# Patient Record
Sex: Female | Born: 2000 | Race: Black or African American | Hispanic: Yes | Marital: Single | Smoking: Never smoker
Health system: Southern US, Community
[De-identification: ages and names within clinical notes are randomized; demographics above are authoritative.]

## PROBLEM LIST (undated history)

## (undated) DIAGNOSIS — F411 Generalized anxiety disorder: Secondary | ICD-10-CM

## (undated) DIAGNOSIS — F320 Major depressive disorder, single episode, mild: Secondary | ICD-10-CM

## (undated) HISTORY — PX: TONSILLECTOMY AND ADENOIDECTOMY: SUR1326

---
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
## 2021-09-17 DIAGNOSIS — F338 Other recurrent depressive disorders: Secondary | ICD-10-CM | POA: Diagnosis not present

## 2021-09-17 DIAGNOSIS — R41840 Attention and concentration deficit: Secondary | ICD-10-CM | POA: Diagnosis not present

## 2021-09-17 DIAGNOSIS — F419 Anxiety disorder, unspecified: Secondary | ICD-10-CM | POA: Diagnosis not present

## 2021-11-20 ENCOUNTER — Other Ambulatory Visit: Payer: Self-pay

## 2021-11-20 ENCOUNTER — Encounter: Payer: Self-pay | Admitting: Family Medicine

## 2021-11-20 ENCOUNTER — Ambulatory Visit (INDEPENDENT_AMBULATORY_CARE_PROVIDER_SITE_OTHER): Payer: BC Managed Care – PPO | Admitting: Family Medicine

## 2021-11-20 VITALS — BP 122/68 | HR 76 | Temp 98.4°F | Ht 63.11 in | Wt 178.0 lb

## 2021-11-20 DIAGNOSIS — F909 Attention-deficit hyperactivity disorder, unspecified type: Secondary | ICD-10-CM

## 2021-11-20 DIAGNOSIS — F320 Major depressive disorder, single episode, mild: Secondary | ICD-10-CM | POA: Diagnosis not present

## 2021-11-20 DIAGNOSIS — Z0000 Encounter for general adult medical examination without abnormal findings: Secondary | ICD-10-CM | POA: Diagnosis not present

## 2021-11-20 DIAGNOSIS — F411 Generalized anxiety disorder: Secondary | ICD-10-CM

## 2021-11-20 DIAGNOSIS — Z23 Encounter for immunization: Secondary | ICD-10-CM | POA: Diagnosis not present

## 2021-11-20 NOTE — Progress Notes (Signed)
Assessment & Plan:  1. Well adult exam Preventive health education provided. - CBC with Differential/Platelet - CMP14+EGFR - Lipid panel  2. Depression, major, single episode, mild (HCC) Uncontrolled. Gene Sight testing completed today.  - Ambulatory referral to Psychiatry  3. GAD (generalized anxiety disorder) Uncontrolled. Gene Sight testing completed today.  - Ambulatory referral to Psychiatry  4. Attention deficit hyperactivity disorder (ADHD), unspecified ADHD type Uncontrolled. Gene Sight testing completed today.  - Ambulatory referral to Psychiatry  5. Need for immunization against influenza - Flu Vaccine QUAD 77moIM (Fluarix, Fluzone & Alfiuria Quad PF)   Follow-up: Return in about 2 months (around 01/20/2022) for depression/anxiety.   BHendricks Limes MSN, APRN, FNP-C [facility]  Subjective:  Patient ID: Susan Perez female    DOB: 604-27-2002 Age: 20y.o. MRN: [id number] Patient Care Team: JLoman Brooklyn FNP as PCP - General (Family Medicine)   CC:  Chief Complaint  Patient presents with   Annual Exam    HPI Susan Tischerpresents for her annual physical.   Occupation: sDentist Marital status: single, Substance use: none Diet: regular, Exercise: none Last eye exam: today Last dental exam: unknown Hepatitis C Screening: declined Immunizations: Flu Vaccine:  agreeable to get today Tdap Vaccine: up to date  COVID-19 Vaccine:  patient reports having had previously   Anxiety/Depression: patient is not currently taking medication to treat. Previously failed multiple medications, but does not remember all of them. Upon chart review, I can find Cymbalta, Zoloft, Lexapro, and Prozac. She was previously seeing a psychiatrist in college and needs to get established with a new one. She is still doing therapy. She also reports she [facility], but is unable to afford the  follow-up appointments ($300). They did give her a 30 day supply of Adderal when she was diagnosed which did help her focus.  [facility] 11/20/2021 10/18/2019 02/08/2019  Decreased Interest 1 2 2   Down, Depressed, Hopeless 1 1 1   PHQ - 2 Score 2 3 3   Altered sleeping 3 3 3   Tired, decreased energy 3 3 2   Change in appetite 0 3 2  Feeling bad or failure about yourself  1 1 1   Trouble concentrating 3 3 3   Moving slowly or fidgety/restless 0 0 1  Suicidal thoughts 0 0 0  PHQ-9 Score 12 16 15   Difficult doing work/chores Very difficult Not difficult at all -  Some recent data might be hidden   GAD 7 : Generalized Anxiety Score 11/20/2021 10/18/2019  Nervous, Anxious, on Edge 3 3  Control/stop worrying 3 3  Worry too much - different things 3 3  Trouble relaxing 1 3  Restless 1 2  Easily annoyed or irritable 1 1  Afraid - awful might happen 0 0  Total GAD 7 Score 12 15  Anxiety Difficulty Very difficult Not difficult at all    Review of Systems  Constitutional:  Negative for chills, fever, malaise/fatigue and weight loss.  HENT:  Negative for congestion, ear discharge, ear pain, nosebleeds, sinus pain, sore throat and tinnitus.   Eyes:  Negative for blurred vision, double vision, pain, discharge and redness.  Respiratory:  Negative for cough, shortness of breath and wheezing.   Cardiovascular:  Negative for chest pain, palpitations and leg swelling.  Gastrointestinal:  Negative for abdominal pain, constipation, diarrhea, heartburn, nausea and vomiting.  Genitourinary:  Negative for dysuria, frequency and urgency.  Musculoskeletal:  Negative for  myalgias.  Skin:  Negative for rash.  Neurological:  Negative for dizziness, seizures, weakness and headaches.  Psychiatric/Behavioral:  Positive for depression. Negative for substance abuse and suicidal ideas. The patient is nervous/anxious.     Current Outpatient Medications:    DULoxetine (CYMBALTA) 30 MG capsule, Take 1  capsule (30 mg total) by mouth daily. (Patient not taking: Reported on 11/20/2021), Disp: 30 capsule, Rfl: 2  Allergies  Allergen Reactions   Delsym [Dextromethorphan]     Past Medical History:  Diagnosis Date   Depression, major, single episode, mild ([place]) 02/08/2019   GAD (generalized anxiety disorder) 02/08/2019    Past Surgical History:  Procedure Laterality Date   TONSILLECTOMY AND ADENOIDECTOMY      Family History  Problem Relation Age of Onset   Asthma Father     Social History   Socioeconomic History   Marital status: Single    Spouse name: Not on file   Number of children: Not on file   Years of education: Not on file   Highest education level: Not on file  Occupational History   Not on file  Tobacco Use   Smoking status: Never   Smokeless tobacco: Never  Vaping Use   Vaping Use: Never used  Substance and Sexual Activity   Alcohol use: Never   Drug use: Never   Sexual activity: Never  Other Topics Concern   Not on file  Social History Narrative   Not on file   Social Determinants of Health   Financial Resource Strain: Not on file  Food Insecurity: Not on file  Transportation Needs: Not on file  Physical Activity: Not on file  Stress: Not on file  Social Connections: Not on file  Intimate Partner Violence: Not on file      Objective:    BP 122/68   Pulse 76   Temp 98.4 F (36.9 C) (Temporal)   Ht 5' 3.11" (1.603 m)   Wt 178 lb (80.7 kg)   SpO2 95%   BMI 31.42 kg/m   Wt Readings from Last 3 Encounters:  11/20/21 178 lb (80.7 kg) (94 %, Z= 1.57)*  10/18/19 180 lb (81.6 kg) (96 %, Z= 1.71)*  02/08/19 172 lb (78 kg) (95 %, Z= 1.60)*   * Growth percentiles are based on CDC (Girls, 2-20 Years) data.    Physical Exam Vitals reviewed. Exam conducted with a chaperone present.  Constitutional:      General: She is not in acute distress.    Appearance: Normal appearance. She is not ill-appearing, toxic-appearing or diaphoretic.  HENT:      Head: Normocephalic and atraumatic.     Right Ear: Tympanic membrane, ear canal and external ear normal. There is no impacted cerumen.     Left Ear: Tympanic membrane, ear canal and external ear normal. There is no impacted cerumen.     Nose: Nose normal. No congestion or rhinorrhea.     Mouth/Throat:     Mouth: Mucous membranes are moist.     Pharynx: Oropharynx is clear. No oropharyngeal exudate or posterior oropharyngeal erythema.  Eyes:     General: No scleral icterus.       Right eye: No discharge.        Left eye: No discharge.     Conjunctiva/sclera: Conjunctivae normal.     Pupils: Pupils are equal, round, and reactive to light.  Cardiovascular:     Rate and Rhythm: Normal rate and regular rhythm.     Heart sounds: Normal  heart sounds. No murmur heard.   No friction rub. No gallop.  Pulmonary:     Effort: Pulmonary effort is normal. No respiratory distress.     Breath sounds: Normal breath sounds. No stridor. No wheezing, rhonchi or rales.  Chest:  Breasts:    Breasts are symmetrical.     Right: Normal.     Left: Normal.  Abdominal:     General: Abdomen is flat. Bowel sounds are normal. There is no distension.     Palpations: Abdomen is soft. There is no hepatomegaly, splenomegaly or mass.     Tenderness: There is no abdominal tenderness. There is no guarding or rebound.     Hernia: No hernia is present.  Musculoskeletal:        General: Normal range of motion.     Cervical back: Normal range of motion and neck supple. No rigidity. No muscular tenderness.  Lymphadenopathy:     Cervical: No cervical adenopathy.     Upper Body:     Right upper body: No supraclavicular, axillary or pectoral adenopathy.     Left upper body: No supraclavicular, axillary or pectoral adenopathy.  Skin:    General: Skin is warm and dry.     Capillary Refill: Capillary refill takes less than 2 seconds.  Neurological:     General: No focal deficit present.     Mental Status: She is alert and  oriented to person, place, and time. Mental status is at baseline.  Psychiatric:        Mood and Affect: Mood normal.        Behavior: Behavior normal.        Thought Content: Thought content normal.        Judgment: Judgment normal.    Lab Results  Component Value Date   TSH 3.150 09/20/2018   Lab Results  Component Value Date   WBC 9.8 09/20/2018   HGB 13.6 09/20/2018   HCT 39.9 09/20/2018   MCV 86 09/20/2018   PLT 361 09/20/2018   Lab Results  Component Value Date   NA 140 09/20/2018   K 4.1 09/20/2018   CO2 25 09/20/2018   GLUCOSE 78 09/20/2018   BUN 15 09/20/2018   CREATININE 0.66 09/20/2018   CALCIUM 10.2 09/20/2018   Lab Results  Component Value Date   CHOL 199 (H) 09/20/2018   Lab Results  Component Value Date   HDL 49 09/20/2018   Lab Results  Component Value Date   LDLCALC 136 (H) 09/20/2018   Lab Results  Component Value Date   TRIG 69 09/20/2018   Lab Results  Component Value Date   CHOLHDL 4.1 09/20/2018   No results found for: HGBA1C

## 2021-11-21 LAB — LIPID PANEL
Chol/HDL Ratio: 4.3 ratio (ref 0.0–4.4)
Cholesterol, Total: 220 mg/dL — ABNORMAL HIGH (ref 100–169)
HDL: 51 mg/dL (ref >39)
LDL Chol Calc (NIH): 156 mg/dL — ABNORMAL HIGH (ref 0–109)
Triglycerides: 73 mg/dL (ref 0–89)
VLDL Cholesterol Cal: 13 mg/dL (ref 5–40)

## 2021-11-21 LAB — CBC WITH DIFFERENTIAL/PLATELET
Basophils Absolute: 0.1 10*3/uL (ref 0.0–0.2)
Basos: 1 %
EOS (ABSOLUTE): 0.1 10*3/uL (ref 0.0–0.4)
Eos: 1 %
Hematocrit: 39.4 % (ref 34.0–46.6)
Hemoglobin: 13.3 g/dL (ref 11.1–15.9)
Immature Grans (Abs): 0 10*3/uL (ref 0.0–0.1)
Immature Granulocytes: 0 %
Lymphocytes Absolute: 2.6 10*3/uL (ref 0.7–3.1)
Lymphs: 32 %
MCH: 29.1 pg (ref 26.6–33.0)
MCHC: 33.8 g/dL (ref 31.5–35.7)
MCV: 86 fL (ref 79–97)
Monocytes Absolute: 0.6 10*3/uL (ref 0.1–0.9)
Monocytes: 8 %
Neutrophils Absolute: 4.9 10*3/uL (ref 1.4–7.0)
Neutrophils: 58 %
Platelets: 275 10*3/uL (ref 150–450)
RBC: 4.57 x10E6/uL (ref 3.77–5.28)
RDW: 12.9 % (ref 11.7–15.4)
WBC: 8.3 10*3/uL (ref 3.4–10.8)

## 2021-11-21 LAB — CMP14+EGFR
ALT: 9 IU/L (ref 0–32)
AST: 13 IU/L (ref 0–40)
Albumin/Globulin Ratio: 1.9 (ref 1.2–2.2)
Albumin: 4.4 g/dL (ref 3.9–5.0)
Alkaline Phosphatase: 88 IU/L (ref 42–106)
BUN/Creatinine Ratio: 24 — ABNORMAL HIGH (ref 9–23)
BUN: 15 mg/dL (ref 6–20)
Bilirubin Total: 0.2 mg/dL (ref 0.0–1.2)
CO2: 24 mmol/L (ref 20–29)
Calcium: 9.5 mg/dL (ref 8.7–10.2)
Chloride: 103 mmol/L (ref 96–106)
Creatinine, Ser: 0.62 mg/dL (ref 0.57–1.00)
Globulin, Total: 2.3 g/dL (ref 1.5–4.5)
Glucose: 82 mg/dL (ref 70–99)
Potassium: 4.2 mmol/L (ref 3.5–5.2)
Sodium: 139 mmol/L (ref 134–144)
Total Protein: 6.7 g/dL (ref 6.0–8.5)
eGFR: 131 mL/min/{1.73_m2} (ref >59)

## 2021-11-25 ENCOUNTER — Encounter: Payer: Self-pay | Admitting: Family Medicine

## 2021-11-25 DIAGNOSIS — E785 Hyperlipidemia, unspecified: Secondary | ICD-10-CM

## 2021-11-25 DIAGNOSIS — F909 Attention-deficit hyperactivity disorder, unspecified type: Secondary | ICD-10-CM | POA: Insufficient documentation

## 2021-11-25 HISTORY — DX: Hyperlipidemia, unspecified: E78.5

## 2021-11-26 NOTE — Progress Notes (Signed)
Chart updated

## 2021-12-11 ENCOUNTER — Telehealth: Payer: Self-pay | Admitting: Family Medicine

## 2021-12-11 DIAGNOSIS — F411 Generalized anxiety disorder: Secondary | ICD-10-CM

## 2021-12-11 DIAGNOSIS — F320 Major depressive disorder, single episode, mild: Secondary | ICD-10-CM

## 2021-12-11 MED ORDER — VILAZODONE HCL 20 MG PO TABS: ORAL_TABLET | ORAL | 2 refills | Status: DC

## 2021-12-11 NOTE — Telephone Encounter (Signed)
Pt said that she will try the Viibryd.

## 2021-12-11 NOTE — Telephone Encounter (Signed)
Lmtcb.

## 2021-12-11 NOTE — Telephone Encounter (Signed)
Please let patient know I received her GeneSight test results.  I recommend trying Viibryd if she is agreeable.  Please let me know and I will send this in.

## 2021-12-11 NOTE — Telephone Encounter (Signed)
Viibryd rx'd.

## 2022-01-16 ENCOUNTER — Ambulatory Visit (INDEPENDENT_AMBULATORY_CARE_PROVIDER_SITE_OTHER): Payer: BC Managed Care – PPO | Admitting: Family Medicine

## 2022-01-16 ENCOUNTER — Encounter: Payer: Self-pay | Admitting: Family Medicine

## 2022-01-16 VITALS — BP 116/72 | HR 62 | Temp 98.4°F | Ht 63.11 in | Wt 179.0 lb

## 2022-01-16 DIAGNOSIS — F320 Major depressive disorder, single episode, mild: Secondary | ICD-10-CM | POA: Diagnosis not present

## 2022-01-16 DIAGNOSIS — F411 Generalized anxiety disorder: Secondary | ICD-10-CM

## 2022-01-16 MED ORDER — VILAZODONE HCL 40 MG PO TABS: 40.0000 mg | ORAL_TABLET | Freq: Every day | ORAL | 2 refills | Status: DC

## 2022-01-16 NOTE — Progress Notes (Signed)
Assessment & Plan:  1-2. Depression, major, single episode, mild (HCC)/GAD (generalized anxiety disorder) - improving, increase Viibryd from 20 mg to 40 mg daily - printed information about mindfulness provided   Return in about 6 weeks (around 02/27/2022) for Depression/Anxiety.  Susan Crater, NP Student  I personally was present during the history, physical exam, and medical decision-making activities of this service and have verified that the service and findings are accurately documented in the nurse practitioner student's note.  Susan Limes, MSN, APRN, FNP-C [facility]  Subjective:    Patient ID: Susan Perez, female    DOB: 2001/03/05, 21 y.o.   MRN: [id number]  Patient Care Team: Loman Brooklyn, FNP as PCP - General (Family Medicine)   Chief Complaint:  Chief Complaint  Patient presents with   Anxiety   Depression    2 month follow up- states it is better    HPI: Susan Perez is a 21 y.o. female presenting on 01/16/2022 for Anxiety and Depression (2 month follow up- states it is better)  She was started on Viibryd 20 mg after her Genesight test resulted. She states this is working well for her and she feels better but is not quite where she wants to be.   Depression screen [facility] 2/9 01/16/2022 11/20/2021 10/18/2019 02/08/2019 01/06/2019  Decreased Interest _0 Down, Depressed, Hopeless _1 PHQ - 2 Score _2 Altered sleeping _3 Tired, decreased energy _4 Change in appetite 0 0 _5 Feeling bad or failure about yourself  0 _6 Trouble concentrating _7 Moving slowly or fidgety/restless 0 0 0 1 3  Suicidal thoughts 0 0 0 0 0  PHQ-9 Score _8 Difficult doing work/chores Somewhat difficult Very difficult Not difficult at all - Somewhat difficult  Some recent data might be hidden   GAD 7 : Generalized Anxiety Score 01/16/2022 11/20/2021 10/18/2019  Nervous, Anxious, on Edge _9 Control/stop worrying _10 Worry too much - different things _11 Trouble relaxing _12 Restless _13 Easily annoyed or irritable _14 Afraid - awful might happen 1 0 0  Total GAD 7 Score _15 Anxiety Difficulty Somewhat difficult Very difficult Not difficult at all   New complaints: None  Social history:  Relevant past medical, surgical, family and social history reviewed and updated as indicated. Interim medical history since our last visit reviewed.  Allergies and medications reviewed and updated.  DATA REVIEWED: CHART IN EPIC  ROS: Negative unless specifically indicated above in HPI.    Current Outpatient Medications:    Vilazodone HCl (VIIBRYD) 20 MG TABS, Take 10 mg by mouth daily x7 days, then increase to 20 mg daily. (Patient taking differently: Take 20 mg by mouth daily at 2 PM.), Disp: 30 tablet, Rfl: 2   Allergies  Allergen Reactions   Delsym [Dextromethorphan]    Past Medical History:  Diagnosis Date   Depression, major, single episode, mild ([place]) 02/08/2019   GAD (generalized anxiety disorder) 02/08/2019   Hyperlipidemia 11/25/2021    Past Surgical History:  Procedure Laterality Date   TONSILLECTOMY AND ADENOIDECTOMY      Social History   Socioeconomic History   Marital status: Single  Spouse name: Not on file   Number of children: Not on file   Years of education: Not on file   Highest education level: Not on file  Occupational History   Occupation: Dentist    Employer: [employer]  Tobacco Use   Smoking status: Never   Smokeless tobacco: Never  Vaping Use   Vaping Use: Never used  Substance and Sexual Activity   Alcohol use: Never   Drug use: Never   Sexual activity: Never  Other Topics Concern   Not on file  Social History Narrative   Not on file   Social Determinants of Health   Financial Resource Strain: Not on file  Food Insecurity: Not on file  Transportation Needs: Not on file  Physical  Activity: Not on file  Stress: Not on file  Social Connections: Not on file  Intimate Partner Violence: Not on file        Objective:    BP 116/72    Pulse 62    Temp 98.4 F (36.9 C) (Temporal)    Ht 5' 3.11" (1.603 m)    Wt 81.2 kg    SpO2 100%    BMI 31.60 kg/m   Wt Readings from Last 3 Encounters:  01/16/22 179 lb (81.2 kg) (94 %, Z= 1.58)*  11/20/21 178 lb (80.7 kg) (94 %, Z= 1.57)*  10/18/19 180 lb (81.6 kg) (96 %, Z= 1.71)*   * Growth percentiles are based on CDC (Girls, 2-20 Years) data.    Physical Exam Vitals reviewed.  Constitutional:      General: She is not in acute distress.    Appearance: Normal appearance. She is not ill-appearing, toxic-appearing or diaphoretic.  HENT:     Head: Normocephalic and atraumatic.     Nose: Nose normal.     Mouth/Throat:     Mouth: Mucous membranes are moist.     Pharynx: Oropharynx is clear.  Eyes:     Extraocular Movements: Extraocular movements intact.     Conjunctiva/sclera: Conjunctivae normal.     Pupils: Pupils are equal, round, and reactive to light.  Pulmonary:     Effort: Pulmonary effort is normal. No respiratory distress.  Abdominal:     General: There is no distension.     Palpations: Abdomen is soft. There is no mass.  Musculoskeletal:        General: Normal range of motion.     Cervical back: Normal range of motion.  Skin:    General: Skin is warm and dry.  Neurological:     General: No focal deficit present.     Mental Status: She is alert and oriented to person, place, and time.     Motor: No weakness.     Gait: Gait normal.  Psychiatric:        Mood and Affect: Mood normal.        Behavior: Behavior normal.        Thought Content: Thought content normal.        Judgment: Judgment normal.    Lab Results  Component Value Date   TSH 3.150 09/20/2018   Lab Results  Component Value Date   WBC 8.3 11/20/2021   HGB 13.3 11/20/2021   HCT 39.4 11/20/2021   MCV 86 11/20/2021   PLT 275 11/20/2021    Lab Results  Component Value Date   NA 139 11/20/2021   K 4.2 11/20/2021   CO2 24 11/20/2021   GLUCOSE 82 11/20/2021   BUN 15  11/20/2021   CREATININE 0.62 11/20/2021   BILITOT 0.2 11/20/2021   ALKPHOS 88 11/20/2021   AST 13 11/20/2021   ALT 9 11/20/2021   PROT 6.7 11/20/2021   ALBUMIN 4.4 11/20/2021   CALCIUM 9.5 11/20/2021   EGFR 131 11/20/2021   Lab Results  Component Value Date   CHOL 220 (H) 11/20/2021   Lab Results  Component Value Date   HDL 51 11/20/2021   Lab Results  Component Value Date   LDLCALC 156 (H) 11/20/2021   Lab Results  Component Value Date   TRIG 73 11/20/2021   Lab Results  Component Value Date   CHOLHDL 4.3 11/20/2021   No results found for: HGBA1C

## 2022-01-17 ENCOUNTER — Encounter: Payer: Self-pay | Admitting: Family Medicine

## 2022-02-28 ENCOUNTER — Encounter: Payer: Self-pay | Admitting: Family Medicine

## 2022-02-28 ENCOUNTER — Ambulatory Visit (INDEPENDENT_AMBULATORY_CARE_PROVIDER_SITE_OTHER): Payer: BC Managed Care – PPO | Admitting: Family Medicine

## 2022-02-28 VITALS — BP 113/75 | HR 85 | Temp 98.2°F | Ht 63.11 in | Wt 177.8 lb

## 2022-02-28 DIAGNOSIS — F909 Attention-deficit hyperactivity disorder, unspecified type: Secondary | ICD-10-CM | POA: Diagnosis not present

## 2022-02-28 DIAGNOSIS — F411 Generalized anxiety disorder: Secondary | ICD-10-CM

## 2022-02-28 DIAGNOSIS — F320 Major depressive disorder, single episode, mild: Secondary | ICD-10-CM

## 2022-02-28 MED ORDER — ATOMOXETINE HCL 40 MG PO CAPS: 40.0000 mg | ORAL_CAPSULE | Freq: Every day | ORAL | 2 refills | Status: DC

## 2022-02-28 NOTE — Patient Instructions (Signed)
[facility] [address], [address] - [place] [address] [phone] 

## 2022-02-28 NOTE — Progress Notes (Signed)
? ?Assessment & Plan:  ?1. Attention deficit hyperactivity disorder (ADHD), unspecified ADHD type ?Uncontrolled.  Started patient on Strattera.  Name, address, and telephone number provided to the psychiatrist she was referred to so that she can call and schedule an appointment. ?- atomoxetine (STRATTERA) 40 MG capsule; Take 1 capsule (40 mg total) by mouth daily.  Dispense: 30 capsule; Refill: 2 ? ?2-3. Depression, major, single episode, mild (HCC)AD (generalized anxiety disorder) ?After discussing, we agreed to try to work on her ADHD first to see how much of an impact this has on her depression and anxiety type symptoms. ? ? ?Return in about 6 weeks (around 04/11/2022) for Depression, Anxiety, & ADHD. ? ?[name], MSN, APRN, FNP-C ?[place] ? ?Subjective:  ? ? Patient ID: Susan Perez, female    DOB: 10-12-2001, 21 y.o.   MRN: [id number] ? ?Patient Care Team: ?Loman Brooklyn, FNP as PCP - General (Family Medicine)  ? ?Chief Complaint:  ?Chief Complaint  ?Patient presents with  ? Anxiety  ? Depression  ?  6 week follow up. Patient states that she only took the medication for 1 week and stopped because she was having hallucinations.   ? ? ?HPI: ?Susan Perez is a 21 y.o. female presenting on 02/28/2022 for Anxiety and Depression (6 week follow up. Patient states that she only took the medication for 1 week and stopped because she was having hallucinations. ) ? ?She was started on Viibryd 20 mg after her Genesight test resulted.  It was increased to 40 mg at our last visit as patient felt it was working well for her, but she was not quite where she wants to be.  She reports today she started having hallucinations on the increased dose so she quit taking it altogether.  She was also previously referred to psychiatry for management of ADHD, but has not heard from anyone to schedule. ? ?Depression screen [facility] 2/9 02/28/2022 01/16/2022 11/20/2021 10/18/2019 02/08/2019  ?Decreased Interest 2 1  1 2 2   ?Down, Depressed, Hopeless 1 1 1 1 1   ?PHQ - 2 Score 3 2 2 3 3   ?Altered sleeping 2 2 3 3 3   ?Tired, decreased energy 2 1 3 3 2   ?Change in appetite 0 0 0 3 2  ?Feeling bad or failure about yourself  1 0 1 1 1   ?Trouble concentrating 3 2 3 3 3   ?Moving slowly or fidgety/restless 0 0 0 0 1  ?Suicidal thoughts 0 0 0 0 0  ?PHQ-9 Score 11 7 12 16 15   ?Difficult doing work/chores Very difficult Somewhat difficult Very difficult Not difficult at all -  ?Some recent data might be hidden  ? ?GAD 7 : Generalized Anxiety Score 02/28/2022 01/16/2022 11/20/2021 10/18/2019  ?Nervous, Anxious, on Edge 2 2 3 3   ?Control/stop worrying 1 2 3 3   ?Worry too much - different things 1 2 3 3   ?Trouble relaxing 1 2 1 3   ?Restless 2 2 1 2   ?Easily annoyed or irritable 1 1 1 1   ?Afraid - awful might happen 0 1 0 0  ?Total GAD 7 Score 8 12 12 15   ?Anxiety Difficulty Somewhat difficult Somewhat difficult Very difficult Not difficult at all  ? ?New complaints: ?None ? ? ?Social history: ? ?Relevant past medical, surgical, family and social history reviewed and updated as indicated. Interim medical history since our last visit reviewed. ? ?Allergies and medications reviewed and updated. ? ?DATA REVIEWED: CHART IN EPIC ? ?ROS: Negative  unless specifically indicated above in HPI.  ? ? ?Current Outpatient Medications:  ?  Vilazodone HCl (VIIBRYD) 40 MG TABS, Take 1 tablet (40 mg total) by mouth daily. (Patient not taking: Reported on 02/28/2022), Disp: 30 tablet, Rfl: 2  ? ?Allergies  ?Allergen Reactions  ? Delsym [Dextromethorphan]   ? ?Past Medical History:  ?Diagnosis Date  ? Depression, major, single episode, mild ([place]) 02/08/2019  ? GAD (generalized anxiety disorder) 02/08/2019  ? Hyperlipidemia 11/25/2021  ?  ?Past Surgical History:  ?Procedure Laterality Date  ? TONSILLECTOMY AND ADENOIDECTOMY    ?  ?Social History  ? ?Socioeconomic History  ? Marital status: Single  ?  Spouse name: Not on file  ? Number of children: Not on file  ? Years  of education: Not on file  ? Highest education level: Not on file  ?Occupational History  ? Occupation: Dentist  ?  Employer: [employer]  ?Tobacco Use  ? Smoking status: Never  ? Smokeless tobacco: Never  ?Vaping Use  ? Vaping Use: Never used  ?Substance and Sexual Activity  ? Alcohol use: Never  ? Drug use: Never  ? Sexual activity: Never  ?Other Topics Concern  ? Not on file  ?Social History Narrative  ? Not on file  ? ?Social Determinants of Health  ? ?Financial Resource Strain: Not on file  ?Food Insecurity: Not on file  ?Transportation Needs: Not on file  ?Physical Activity: Not on file  ?Stress: Not on file  ?Social Connections: Not on file  ?Intimate Partner Violence: Not on file  ?  ? ?   ?Objective:  ?  ?BP 113/75   Pulse 85   Temp 98.2 ?F (36.8 ?C) (Temporal)   Ht 5' 3.11" (1.603 m)   Wt 177 lb 12.8 oz (80.6 kg)   BMI 31.39 kg/m?  ? ?Wt Readings from Last 3 Encounters:  ?02/28/22 177 lb 12.8 oz (80.6 kg) (94 %, Z= 1.55)*  ?01/16/22 179 lb (81.2 kg) (94 %, Z= 1.58)*  ?11/20/21 178 lb (80.7 kg) (94 %, Z= 1.57)*  ? ?* Growth percentiles are based on CDC (Girls, 2-20 Years) data.  ? ? ?Physical Exam ?Vitals reviewed.  ?Constitutional:   ?   General: She is not in acute distress. ?   Appearance: Normal appearance. She is not ill-appearing, toxic-appearing or diaphoretic.  ?HENT:  ?   Head: Normocephalic and atraumatic.  ?   Nose: Nose normal.  ?   Mouth/Throat:  ?   Mouth: Mucous membranes are moist.  ?   Pharynx: Oropharynx is clear.  ?Eyes:  ?   Extraocular Movements: Extraocular movements intact.  ?   Conjunctiva/sclera: Conjunctivae normal.  ?   Pupils: Pupils are equal, round, and reactive to light.  ?Pulmonary:  ?   Effort: Pulmonary effort is normal. No respiratory distress.  ?Abdominal:  ?   General: There is no distension.  ?   Palpations: Abdomen is soft. There is no mass.  ?Musculoskeletal:     ?   General: Normal range of motion.  ?   Cervical back: Normal range of motion.   ?Skin: ?   General: Skin is warm and dry.  ?Neurological:  ?   General: No focal deficit present.  ?   Mental Status: She is alert and oriented to person, place, and time.  ?   Motor: No weakness.  ?   Gait: Gait normal.  ?Psychiatric:     ?   Mood and Affect: Mood normal.     ?  Behavior: Behavior normal.     ?   Thought Content: Thought content normal.     ?   Judgment: Judgment normal.  ? ? ?Lab Results  ?Component Value Date  ? TSH 3.150 09/20/2018  ? ?Lab Results  ?Component Value Date  ? WBC 8.3 11/20/2021  ? HGB 13.3 11/20/2021  ? HCT 39.4 11/20/2021  ? MCV 86 11/20/2021  ? PLT 275 11/20/2021  ? ?Lab Results  ?Component Value Date  ? NA 139 11/20/2021  ? K 4.2 11/20/2021  ? CO2 24 11/20/2021  ? GLUCOSE 82 11/20/2021  ? BUN 15 11/20/2021  ? CREATININE 0.62 11/20/2021  ? BILITOT 0.2 11/20/2021  ? ALKPHOS 88 11/20/2021  ? AST 13 11/20/2021  ? ALT 9 11/20/2021  ? PROT 6.7 11/20/2021  ? ALBUMIN 4.4 11/20/2021  ? CALCIUM 9.5 11/20/2021  ? EGFR 131 11/20/2021  ? ?Lab Results  ?Component Value Date  ? CHOL 220 (H) 11/20/2021  ? ?Lab Results  ?Component Value Date  ? HDL 51 11/20/2021  ? ?Lab Results  ?Component Value Date  ? Stone 156 (H) 11/20/2021  ? ?Lab Results  ?Component Value Date  ? TRIG 73 11/20/2021  ? ?Lab Results  ?Component Value Date  ? CHOLHDL 4.3 11/20/2021  ? ?No results found for: HGBA1C ? ?   ? ? ? ? ? ?

## 2022-03-03 ENCOUNTER — Encounter: Payer: Self-pay | Admitting: Family Medicine

## 2022-04-11 ENCOUNTER — Ambulatory Visit: Payer: BC Managed Care – PPO | Admitting: Family Medicine

## 2022-04-11 ENCOUNTER — Encounter: Payer: Self-pay | Admitting: Family Medicine

## 2022-08-13 ENCOUNTER — Ambulatory Visit (INDEPENDENT_AMBULATORY_CARE_PROVIDER_SITE_OTHER): Payer: BC Managed Care – PPO | Admitting: Family Medicine

## 2022-08-13 ENCOUNTER — Encounter: Payer: Self-pay | Admitting: Family Medicine

## 2022-08-13 VITALS — BP 112/68 | HR 76 | Temp 98.6°F | Ht 64.0 in | Wt 186.0 lb

## 2022-08-13 DIAGNOSIS — F411 Generalized anxiety disorder: Secondary | ICD-10-CM | POA: Diagnosis not present

## 2022-08-13 DIAGNOSIS — F909 Attention-deficit hyperactivity disorder, unspecified type: Secondary | ICD-10-CM

## 2022-08-13 DIAGNOSIS — F320 Major depressive disorder, single episode, mild: Secondary | ICD-10-CM

## 2022-08-13 MED ORDER — DESVENLAFAXINE SUCCINATE ER 25 MG PO TB24: 25.0000 mg | ORAL_TABLET | Freq: Every day | ORAL | 2 refills | Status: DC

## 2022-08-13 NOTE — Progress Notes (Signed)
Assessment & Plan:  1. Depression, major, single episode, mild (HCC) Uncontrolled. Starting Pristiq. Encouraged to start L Methylfolate 7.5 mg nighty.  - desvenlafaxine (PRISTIQ) 25 MG 24 hr tablet; Take 1 tablet (25 mg total) by mouth daily.  Dispense: 30 tablet; Refill: 2  2. GAD (generalized anxiety disorder) Uncontrolled. Starting Pristiq. - desvenlafaxine (PRISTIQ) 25 MG 24 hr tablet; Take 1 tablet (25 mg total) by mouth daily.  Dispense: 30 tablet; Refill: 2  3. Attention deficit hyperactivity disorder (ADHD), unspecified ADHD type Uncontrolled. Will address after anxiety is under better control.    Return in about 6 weeks (around 09/24/2022) for Mood with ME.  [name], MSN, APRN, FNP-C [facility]  Subjective:    Patient ID: Susan Perez, female    DOB: 02-Apr-2001, 21 y.o.   MRN: [id number]  Patient Care Team: Loman Brooklyn, FNP as PCP - General (Family Medicine)   Chief Complaint:  Chief Complaint  Patient presents with   Anxiety   ADHD    HPI: Susan Perez is a 21 y.o. female presenting on 08/13/2022 for Anxiety and ADHD  Patient presents today with concerns regarding anxiety and ADHD. She is starting her masters program in school and would really like to get this under control so that she will do well in school. She feels her anxiety is worse than her ADHD and would therefore like to concentrate on this first. She has previously failed treatment with Viibryd, Cymbalta, Lexapro, and Zoloft. She has completed GeneSight testing. She was diagnosed with ADHD by [facility], but is unable to afford the follow-up appointments ($300). They did give her a 30 day supply of Adderal when she was diagnosed which did help her focus.     08/13/2022   11:39 AM 02/28/2022    1:57 PM 01/16/2022   10:28 AM 11/20/2021    2:03 PM 10/18/2019    2:32 PM  Depression screen PHQ 2/9  Decreased Interest 1 2 1 1 2   Down, Depressed, Hopeless  1 1 1 1 1   PHQ - 2 Score 2 3 2 2 3   Altered sleeping 2 2 2 3 3   Tired, decreased energy 3 2 1 3 3   Change in appetite 0 0 0 0 3  Feeling bad or failure about yourself  0 1 0 1 1  Trouble concentrating 3 3 2 3 3   Moving slowly or fidgety/restless 0 0 0 0 0  Suicidal thoughts 0 0 0 0 0  PHQ-9 Score 10 11 7 12 16   Difficult doing work/chores Very difficult Very difficult Somewhat difficult Very difficult Not difficult at all      08/13/2022   11:39 AM 02/28/2022    1:58 PM 01/16/2022   10:29 AM 11/20/2021    2:03 PM  GAD 7 : Generalized Anxiety Score  Nervous, Anxious, on Edge 3 2 2 3   Control/stop worrying 1 1 2 3   Worry too much - different things 3 1 2 3   Trouble relaxing 1 1 2 1   Restless 2 2 2 1   Easily annoyed or irritable 1 1 1 1   Afraid - awful might happen 2 0 1 0  Total GAD 7 Score 13 8 12 12   Anxiety Difficulty Very difficult Somewhat difficult Somewhat difficult Very difficult   New complaints: None   Social history:  Relevant past medical, surgical, family and social history reviewed and updated as indicated. Interim medical history since our last visit reviewed.  Allergies and medications  reviewed and updated.  DATA REVIEWED: CHART IN EPIC  ROS: Negative unless specifically indicated above in HPI.    Current Outpatient Medications:    atomoxetine (STRATTERA) 40 MG capsule, Take 1 capsule (40 mg total) by mouth daily., Disp: 30 capsule, Rfl: 2   Allergies  Allergen Reactions   Delsym [Dextromethorphan]    Past Medical History:  Diagnosis Date   Depression, major, single episode, mild ([place]) 02/08/2019   GAD (generalized anxiety disorder) 02/08/2019   Hyperlipidemia 11/25/2021    Past Surgical History:  Procedure Laterality Date   TONSILLECTOMY AND ADENOIDECTOMY      Social History   Socioeconomic History   Marital status: Single    Spouse name: Not on file   Number of children: Not on file   Years of education: Not on file   Highest education  level: Not on file  Occupational History   Occupation: Dentist    Employer: [employer]  Tobacco Use   Smoking status: Never   Smokeless tobacco: Never  Vaping Use   Vaping Use: Never used  Substance and Sexual Activity   Alcohol use: Never   Drug use: Never   Sexual activity: Never  Other Topics Concern   Not on file  Social History Narrative   Not on file   Social Determinants of Health   Financial Resource Strain: Not on file  Food Insecurity: Not on file  Transportation Needs: Not on file  Physical Activity: Not on file  Stress: Not on file  Social Connections: Not on file  Intimate Partner Violence: Not on file        Objective:    BP 112/68   Pulse 76   Temp 98.6 F (37 C)   Ht 5' 4"  (1.626 m)   Wt 186 lb (84.4 kg)   LMP 07/16/2022 (Approximate)   SpO2 98%   BMI 31.93 kg/m   Wt Readings from Last 3 Encounters:  08/13/22 186 lb (84.4 kg)  02/28/22 177 lb 12.8 oz (80.6 kg) (94 %, Z= 1.55)*  01/16/22 179 lb (81.2 kg) (94 %, Z= 1.58)*   * Growth percentiles are based on CDC (Girls, 2-20 Years) data.    Physical Exam Vitals reviewed.  Constitutional:      General: She is not in acute distress.    Appearance: Normal appearance. She is not ill-appearing, toxic-appearing or diaphoretic.  HENT:     Head: Normocephalic and atraumatic.     Nose: Nose normal.     Mouth/Throat:     Mouth: Mucous membranes are moist.     Pharynx: Oropharynx is clear.  Eyes:     Extraocular Movements: Extraocular movements intact.     Conjunctiva/sclera: Conjunctivae normal.     Pupils: Pupils are equal, round, and reactive to light.  Pulmonary:     Effort: Pulmonary effort is normal. No respiratory distress.  Abdominal:     General: There is no distension.     Palpations: Abdomen is soft. There is no mass.  Musculoskeletal:        General: Normal range of motion.     Cervical back: Normal range of motion.  Skin:    General: Skin is warm and dry.   Neurological:     General: No focal deficit present.     Mental Status: She is alert and oriented to person, place, and time.     Motor: No weakness.     Gait: Gait normal.  Psychiatric:  Mood and Affect: Mood normal.        Behavior: Behavior normal.        Thought Content: Thought content normal.        Judgment: Judgment normal.     Lab Results  Component Value Date   TSH 3.150 09/20/2018   Lab Results  Component Value Date   WBC 8.3 11/20/2021   HGB 13.3 11/20/2021   HCT 39.4 11/20/2021   MCV 86 11/20/2021   PLT 275 11/20/2021   Lab Results  Component Value Date   NA 139 11/20/2021   K 4.2 11/20/2021   CO2 24 11/20/2021   GLUCOSE 82 11/20/2021   BUN 15 11/20/2021   CREATININE 0.62 11/20/2021   BILITOT 0.2 11/20/2021   ALKPHOS 88 11/20/2021   AST 13 11/20/2021   ALT 9 11/20/2021   PROT 6.7 11/20/2021   ALBUMIN 4.4 11/20/2021   CALCIUM 9.5 11/20/2021   EGFR 131 11/20/2021   Lab Results  Component Value Date   CHOL 220 (H) 11/20/2021   Lab Results  Component Value Date   HDL 51 11/20/2021   Lab Results  Component Value Date   LDLCALC 156 (H) 11/20/2021   Lab Results  Component Value Date   TRIG 73 11/20/2021   Lab Results  Component Value Date   CHOLHDL 4.3 11/20/2021   No results found for: "HGBA1C"

## 2022-08-13 NOTE — Patient Instructions (Addendum)
L Methylfolate 7.5 mg  Dr. Joesph July Rakes [facility] [place]

## 2022-09-24 ENCOUNTER — Ambulatory Visit: Payer: BC Managed Care – PPO | Admitting: Family Medicine

## 2022-09-25 ENCOUNTER — Encounter: Payer: Self-pay | Admitting: Family Medicine

## 2022-09-25 ENCOUNTER — Ambulatory Visit (INDEPENDENT_AMBULATORY_CARE_PROVIDER_SITE_OTHER): Payer: BC Managed Care – PPO | Admitting: Family Medicine

## 2022-09-25 ENCOUNTER — Ambulatory Visit: Payer: BC Managed Care – PPO | Admitting: Family Medicine

## 2022-09-25 VITALS — BP 106/66 | HR 72 | Temp 98.4°F | Ht 64.0 in | Wt 179.8 lb

## 2022-09-25 DIAGNOSIS — F902 Attention-deficit hyperactivity disorder, combined type: Secondary | ICD-10-CM | POA: Diagnosis not present

## 2022-09-25 DIAGNOSIS — F411 Generalized anxiety disorder: Secondary | ICD-10-CM

## 2022-09-25 DIAGNOSIS — F320 Major depressive disorder, single episode, mild: Secondary | ICD-10-CM | POA: Diagnosis not present

## 2022-09-25 DIAGNOSIS — Z23 Encounter for immunization: Secondary | ICD-10-CM

## 2022-09-25 MED ORDER — DESVENLAFAXINE SUCCINATE ER 50 MG PO TB24: 50.0000 mg | ORAL_TABLET | Freq: Every day | ORAL | 3 refills | Status: DC

## 2022-09-25 MED ORDER — METHYLPHENIDATE HCL ER (OSM) 36 MG PO TBCR: 36.0000 mg | EXTENDED_RELEASE_TABLET | Freq: Every day | ORAL | 0 refills | Status: DC

## 2022-09-25 NOTE — Progress Notes (Signed)
Acute Office Visit  Subjective:     Patient ID: Susan Perez, female    DOB: 2001-02-01, 21 y.o.   MRN: [id number]  Chief Complaint  Patient presents with   Depression    Depression        Patient is in today for follow up for anxiety and depression. She started on pristiq 6 weeks ago at 25 mg. She has noticed an improvement in symptoms. She denies side effects.   She also has ADHD. She is currents working on her master's degree and writing a thesis. This has been difficult for her. She was seen and evaluated by [facility] but the cost of follow up appts were $300 and she is unable to afford this. They did do testing she shows she has significant symptoms of ADHD. There is a copy of file. She has failed strattera in the past. She did try adderall for about 1 month and could tell some improvement without significant side effects. She would like to work of treating this to help with her grades at school.      09/25/2022    2:16 PM 08/13/2022   11:39 AM 02/28/2022    1:57 PM  Depression screen PHQ 2/9  Decreased Interest 1 1 2   Down, Depressed, Hopeless 1 1 1   PHQ - 2 Score 2 2 3   Altered sleeping 1 2 2   Tired, decreased energy 1 3 2   Change in appetite 0 0 0  Feeling bad or failure about yourself  0 0 1  Trouble concentrating 2 3 3   Moving slowly or fidgety/restless 0 0 0  Suicidal thoughts 0 0 0  PHQ-9 Score 6 10 11   Difficult doing work/chores Somewhat difficult Very difficult Very difficult      09/25/2022    2:17 PM 08/13/2022   11:39 AM 02/28/2022    1:58 PM 01/16/2022   10:29 AM  GAD 7 : Generalized Anxiety Score  Nervous, Anxious, on Edge 2 3 2 2   Control/stop worrying 2 1 1 2   Worry too much - different things 1 3 1 2   Trouble relaxing 1 1 1 2   Restless 1 2 2 2   Easily annoyed or irritable 1 1 1 1   Afraid - awful might happen 0 2 0 1  Total GAD 7 Score 8 13 8 12   Anxiety Difficulty Somewhat difficult Very difficult Somewhat difficult Somewhat  difficult     Review of Systems  Psychiatric/Behavioral:  Positive for depression.    As per HPI.      Objective:    BP 106/66   Pulse 72   Temp 98.4 F (36.9 C)   Ht 5\' 4"  (1.626 m)   Wt 179 lb 12.8 oz (81.6 kg)   BMI 30.86 kg/m    Physical Exam Vitals and nursing note reviewed.  Constitutional:      General: She is not in acute distress.    Appearance: She is not ill-appearing, toxic-appearing or diaphoretic.  HENT:     Head: Normocephalic and atraumatic.  Cardiovascular:     Rate and Rhythm: Normal rate and regular rhythm.     Heart sounds: Normal heart sounds. No murmur heard. Pulmonary:     Effort: Pulmonary effort is normal. No respiratory distress.     Breath sounds: Normal breath sounds.  Musculoskeletal:     Right lower leg: No edema.     Left lower leg: Edema present.  Skin:    General: Skin is warm and dry.  Neurological:     General: No focal deficit present.     Mental Status: She is alert and oriented to person, place, and time.  Psychiatric:        Mood and Affect: Mood normal.        Thought Content: Thought content normal.        Judgment: Judgment normal.     No results found for any visits on 09/25/22.      Assessment & Plan:   Susan Perez was seen today for depression.  Diagnoses and all orders for this visit:  Depression, major, single episode, mild (HCC) GAD (generalized anxiety disorder) Fair control. Denies SI. Will increase pristiq to 50 mg daily.  -     desvenlafaxine (PRISTIQ) 50 MG 24 hr tablet; Take 1 tablet (50 mg total) by mouth daily.  Attention deficit hyperactivity disorder (ADHD), combined type Uncontrolled. Unable to afford appts with psychiatry for management. Failed Strattera in past. Will try Concerta daily. PDMP reviewed, no red flags. Discussed CSA and UDS at follow up if she would like to continue. Aware no refills outside of office visits.  -     methylphenidate 36 MG PO CR tablet; Take 1 tablet (36 mg total) by  mouth daily. -     methylphenidate 36 MG PO CR tablet; Take 1 tablet (36 mg total) by mouth daily.   Return in about 8 weeks (around 11/20/2022) for medication follow up.  The patient indicates understanding of these issues and agrees with the plan.  Gwenlyn Perking, FNP

## 2022-09-25 NOTE — Patient Instructions (Signed)
[facility] urine drug screen

## 2022-11-19 ENCOUNTER — Encounter: Payer: Self-pay | Admitting: Family Medicine

## 2022-11-19 ENCOUNTER — Ambulatory Visit (INDEPENDENT_AMBULATORY_CARE_PROVIDER_SITE_OTHER): Payer: BC Managed Care – PPO | Admitting: Family Medicine

## 2022-11-19 VITALS — BP 102/66 | HR 87 | Temp 98.9°F | Ht 64.0 in | Wt 174.0 lb

## 2022-11-19 DIAGNOSIS — F411 Generalized anxiety disorder: Secondary | ICD-10-CM

## 2022-11-19 DIAGNOSIS — F320 Major depressive disorder, single episode, mild: Secondary | ICD-10-CM | POA: Diagnosis not present

## 2022-11-19 DIAGNOSIS — Z79899 Other long term (current) drug therapy: Secondary | ICD-10-CM

## 2022-11-19 DIAGNOSIS — F902 Attention-deficit hyperactivity disorder, combined type: Secondary | ICD-10-CM

## 2022-11-19 MED ORDER — METHYLPHENIDATE HCL ER (OSM) 36 MG PO TBCR: 36.0000 mg | EXTENDED_RELEASE_TABLET | Freq: Every day | ORAL | 0 refills | Status: DC

## 2022-11-19 MED ORDER — DESVENLAFAXINE SUCCINATE ER 50 MG PO TB24: 50.0000 mg | ORAL_TABLET | Freq: Every day | ORAL | 3 refills | Status: DC

## 2022-11-19 NOTE — Progress Notes (Signed)
Established Patient Office Visit  Subjective   Patient ID: Susan Perez, female    DOB: 2001/03/26  Age: 21 y.o. MRN: [id number]  Chief Complaint  Patient presents with   depression/anxiety f/u    HPI Susan Perez is follow up of ADHD, and depression/anxiety.   ADHD Susan Perez reports improvement with Concerta 36 mg. Susan Perez has had improved focus and productivity. Susan Perez denies side effects. Susan Perez has not taken this for about 1 week now as Susan Perez is currently on break from school. Susan Perez would like to continue taking this.   2. Depression/anxiety Susan Perez is currently on Pristiq 50 mg daily. Susan Perez feels like this is controlled her symptoms well without side effects. Susan Perez does not desire a change in regimen currently.      11/19/2022   10:13 AM 09/25/2022    2:16 PM 08/13/2022   11:39 AM  Depression screen PHQ 2/9  Decreased Interest 1 1 1   Down, Depressed, Hopeless 0 1 1  PHQ - 2 Score 1 2 2   Altered sleeping 1 1 2   Tired, decreased energy 2 1 3   Change in appetite 1 0 0  Feeling bad or failure about yourself  0 0 0  Trouble concentrating 2 2 3   Moving slowly or fidgety/restless 0 0 0  Suicidal thoughts 0 0 0  PHQ-9 Score 7 6 10   Difficult doing work/chores Somewhat difficult Somewhat difficult Very difficult      11/19/2022   10:14 AM 09/25/2022    2:17 PM 08/13/2022   11:39 AM 02/28/2022    1:58 PM  GAD 7 : Generalized Anxiety Score  Nervous, Anxious, on Edge 1 2 3 2   Control/stop worrying 0 2 1 1   Worry too much - different things 0 1 3 1   Trouble relaxing 1 1 1 1   Restless 1 1 2 2   Easily annoyed or irritable 1 1 1 1   Afraid - awful might happen 0 0 2 0  Total GAD 7 Score 4 8 13 8   Anxiety Difficulty Somewhat difficult Somewhat difficult Very difficult Somewhat difficult       ROS    Objective:     BP 102/66   Pulse 87   Temp 98.9 F (37.2 C)   Ht 5\' 4"  (1.626 m)   Wt 174 lb (78.9 kg)   SpO2 97%   BMI 29.87 kg/m    Physical Exam Vitals and nursing note reviewed.   Constitutional:      General: Susan Perez is not in acute distress.    Appearance: Susan Perez is not ill-appearing, toxic-appearing or diaphoretic.  Cardiovascular:     Rate and Rhythm: Normal rate and regular rhythm.     Heart sounds: Normal heart sounds. No murmur heard. Pulmonary:     Effort: Pulmonary effort is normal.     Breath sounds: Normal breath sounds.  Musculoskeletal:     Right lower leg: No edema.     Left lower leg: No edema.  Skin:    General: Skin is warm and dry.  Neurological:     General: No focal deficit present.     Mental Status: Susan Perez is alert and oriented to person, place, and time.  Psychiatric:        Mood and Affect: Mood normal.        Behavior: Behavior normal.        Thought Content: Thought content normal.        Judgment: Judgment normal.    No results found for any visits on  11/19/22.    The ASCVD Risk score (Arnett DK, et al., 2019) failed to calculate for the following reasons:   The 2019 ASCVD risk score is only valid for ages 58 to 24    Assessment & Plan:   Susan Perez was seen today for depression/anxiety f/u.  Diagnoses and all orders for this visit:  Depression, major, single episode, mild (HCC) GAD (generalized anxiety disorder) Well controlled on current regimen.  -     desvenlafaxine (PRISTIQ) 50 MG 24 hr tablet; Take 1 tablet (50 mg total) by mouth daily.  Attention deficit hyperactivity disorder (ADHD), combined type Controlled substance agreement signed Well controlled on current regimen. PDMP reviewed, no red flags. CSA and UDS today as Susan Perez would like to continue Concerta. Susan Perez has not had this in about 1 week as Susan Perez has been out of school on break.  -     ToxASSURE Select 13 (MW), Urine -     methylphenidate 36 MG PO CR tablet; Take 1 tablet (36 mg total) by mouth daily. -     methylphenidate 36 MG PO CR tablet; Take 1 tablet (36 mg total) by mouth daily. -     methylphenidate 36 MG PO CR tablet; Take 1 tablet (36 mg total) by mouth  daily.   Return in about 3 months (around 02/19/2023) for CPE.    Gabriel Earing, FNP

## 2023-01-11 ENCOUNTER — Ambulatory Visit (INDEPENDENT_AMBULATORY_CARE_PROVIDER_SITE_OTHER): Payer: BC Managed Care – PPO | Admitting: Oncology

## 2023-01-11 ENCOUNTER — Encounter: Payer: Self-pay | Admitting: Oncology

## 2023-01-11 VITALS — BP 114/68 | HR 85 | Temp 97.8°F | Ht 64.57 in | Wt 175.0 lb

## 2023-01-11 DIAGNOSIS — S59902A Unspecified injury of left elbow, initial encounter: Secondary | ICD-10-CM | POA: Diagnosis not present

## 2023-01-11 DIAGNOSIS — M25522 Pain in left elbow: Secondary | ICD-10-CM

## 2023-01-11 NOTE — Progress Notes (Signed)
[address] [place], [place] [address] Phone: [phone] Fax: [phone]   Office Visit Note  Patient Name: Susan Perez  Date of [id number]  Med Rec number [id number]  Date of Service: 01/11/2023  [facility] [dextromethorphan]  Chief Complaint  Patient presents with   Arm Pain    Left Arm   Patient is an 22 y.o. student here for complaints of left arm pain that started yesterday after falling while skating. Fell backwards onto bilateral palms "catching" herself. Felt immediate pain in both palms but noticed increased pain to left forearm and elbow as she was recovering from fall.  Has to physically move her left arm with her right hand.  Feels most comfortable with left arm slightly bent at elbow held close to her body.  Has not tried wrapping or a sling.  Concerned it may be broken.  Applied some ice and has taken 2 ibuprofen and 1 advil last night a few hours apart. Helped some.    Current Medication:  Outpatient Encounter Medications as of 01/11/2023  Medication Sig   desvenlafaxine (PRISTIQ) 50 MG 24 hr tablet Take 1 tablet (50 mg total) by mouth daily.   [START ON 01/16/2023] methylphenidate 36 MG PO CR tablet Take 1 tablet (36 mg total) by mouth daily.   [DISCONTINUED] methylphenidate 36 MG PO CR tablet Take 1 tablet (36 mg total) by mouth daily. (Patient not taking: Reported on 01/11/2023)   [DISCONTINUED] methylphenidate 36 MG PO CR tablet Take 1 tablet (36 mg total) by mouth daily. (Patient not taking: Reported on 01/11/2023)   No facility-administered encounter medications on file as of 01/11/2023.      Medical History: Past Medical History:  Diagnosis Date   Depression, major, single episode, mild ([facility]) 02/08/2019   GAD (generalized anxiety disorder) 02/08/2019   Hyperlipidemia 11/25/2021     Vital Signs: BP 114/68 (BP Location: Right Arm, Patient Position: Sitting)   Pulse 85   Temp 97.8 F (36.6 C)   Ht 5' 4.57" (1.64 m)   Wt 175  lb (79.4 kg)   SpO2 96%   BMI 29.51 kg/m   ROS: As per HPI.  All other pertinent ROS negative.     Review of Systems  Musculoskeletal:        Left arm pain    Physical Exam Constitutional:      Appearance: Normal appearance.  Musculoskeletal:     Left elbow: Swelling present. Decreased range of motion. Tenderness present in lateral epicondyle and olecranon process.     Left forearm: Swelling, tenderness and bony tenderness present.     Left wrist: Tenderness present. Decreased range of motion.     Comments: Full sensation intact to left arm.  Range of motion decreased in left elbow and wrist.  Edema present.  No obvious deformity although pinpoint tenderness along radius/ulna and at olecranon.   Neurological:     Mental Status: She is alert.     No results found for this or any previous visit (from the past 24 hour(s)).  Assessment/Plan: 1. Left elbow pain -Exam is concerning for fracture/dislocation.  Recommend follow-up with [facility].  Assessed and applied arm sling.  Continue ibuprofen 3 tabs every 6-8 hours as needed for pain.  Please let me know if you need anything additional.  Disposition-follow-up as needed.     General Counseling: Susan Perez understanding of the findings of todays visit and agrees with plan of treatment. I have discussed any further diagnostic evaluation that may  be needed or ordered today. We also reviewed her medications today. she has been encouraged to call the office with any questions or concerns that should arise related to todays visit.   No orders of the defined types were placed in this encounter.   No orders of the defined types were placed in this encounter.   I spent 20 minutes dedicated to the care of this patient (face-to-face and non-face-to-face) on the date of the encounter to include what is described in the assessment and plan.   Faythe Casa, NP 01/11/2023 11:04 AM

## 2023-01-22 DIAGNOSIS — S52125A Nondisplaced fracture of head of left radius, initial encounter for closed fracture: Secondary | ICD-10-CM | POA: Diagnosis not present

## 2023-02-20 ENCOUNTER — Encounter: Payer: Self-pay | Admitting: Family Medicine

## 2023-02-20 ENCOUNTER — Ambulatory Visit (INDEPENDENT_AMBULATORY_CARE_PROVIDER_SITE_OTHER): Payer: BC Managed Care – PPO | Admitting: Family Medicine

## 2023-02-20 VITALS — BP 111/72 | HR 72 | Temp 98.2°F | Resp 20 | Ht 64.5 in | Wt 175.0 lb

## 2023-02-20 DIAGNOSIS — Z13220 Encounter for screening for lipoid disorders: Secondary | ICD-10-CM | POA: Diagnosis not present

## 2023-02-20 DIAGNOSIS — Z79899 Other long term (current) drug therapy: Secondary | ICD-10-CM

## 2023-02-20 DIAGNOSIS — Z0000 Encounter for general adult medical examination without abnormal findings: Secondary | ICD-10-CM

## 2023-02-20 DIAGNOSIS — Z13228 Encounter for screening for other metabolic disorders: Secondary | ICD-10-CM

## 2023-02-20 DIAGNOSIS — Z0001 Encounter for general adult medical examination with abnormal findings: Secondary | ICD-10-CM | POA: Diagnosis not present

## 2023-02-20 DIAGNOSIS — Z130 Encounter for screening for diseases of the blood and blood-forming organs and certain disorders involving the immune mechanism: Secondary | ICD-10-CM

## 2023-02-20 DIAGNOSIS — F902 Attention-deficit hyperactivity disorder, combined type: Secondary | ICD-10-CM

## 2023-02-20 DIAGNOSIS — Z1329 Encounter for screening for other suspected endocrine disorder: Secondary | ICD-10-CM

## 2023-02-20 LAB — LIPID PANEL
Chol/HDL Ratio: 3.9 ratio (ref 0.0–4.4)
Cholesterol, Total: 213 mg/dL — ABNORMAL HIGH (ref 100–199)
HDL: 54 mg/dL (ref >39)
LDL Chol Calc (NIH): 148 mg/dL — ABNORMAL HIGH (ref 0–99)
Triglycerides: 63 mg/dL (ref 0–149)
VLDL Cholesterol Cal: 11 mg/dL (ref 5–40)

## 2023-02-20 LAB — CBC WITH DIFFERENTIAL/PLATELET
Basophils Absolute: 0 10*3/uL (ref 0.0–0.2)
Basos: 0 %
EOS (ABSOLUTE): 0 10*3/uL (ref 0.0–0.4)
Eos: 1 %
Hematocrit: 40.9 % (ref 34.0–46.6)
Hemoglobin: 13.5 g/dL (ref 11.1–15.9)
Immature Grans (Abs): 0 10*3/uL (ref 0.0–0.1)
Immature Granulocytes: 0 %
Lymphocytes Absolute: 1.7 10*3/uL (ref 0.7–3.1)
Lymphs: 32 %
MCH: 29.4 pg (ref 26.6–33.0)
MCHC: 33 g/dL (ref 31.5–35.7)
MCV: 89 fL (ref 79–97)
Monocytes Absolute: 0.4 10*3/uL (ref 0.1–0.9)
Monocytes: 8 %
Neutrophils Absolute: 3.2 10*3/uL (ref 1.4–7.0)
Neutrophils: 59 %
Platelets: 269 10*3/uL (ref 150–450)
RBC: 4.59 x10E6/uL (ref 3.77–5.28)
RDW: 11.9 % (ref 11.7–15.4)
WBC: 5.3 10*3/uL (ref 3.4–10.8)

## 2023-02-20 LAB — CMP14+EGFR
ALT: 7 IU/L (ref 0–32)
AST: 12 IU/L (ref 0–40)
Albumin/Globulin Ratio: 1.7 (ref 1.2–2.2)
Albumin: 4.3 g/dL (ref 4.0–5.0)
Alkaline Phosphatase: 74 IU/L (ref 42–106)
BUN/Creatinine Ratio: 25 — ABNORMAL HIGH (ref 9–23)
BUN: 14 mg/dL (ref 6–20)
Bilirubin Total: 0.4 mg/dL (ref 0.0–1.2)
CO2: 20 mmol/L (ref 20–29)
Calcium: 9.4 mg/dL (ref 8.7–10.2)
Chloride: 102 mmol/L (ref 96–106)
Creatinine, Ser: 0.56 mg/dL — ABNORMAL LOW (ref 0.57–1.00)
Globulin, Total: 2.5 g/dL (ref 1.5–4.5)
Glucose: 78 mg/dL (ref 70–99)
Potassium: 3.9 mmol/L (ref 3.5–5.2)
Sodium: 137 mmol/L (ref 134–144)
Total Protein: 6.8 g/dL (ref 6.0–8.5)
eGFR: 134 mL/min/{1.73_m2} (ref >59)

## 2023-02-20 LAB — TSH: TSH: 1.97 u[IU]/mL (ref 0.450–4.500)

## 2023-02-20 MED ORDER — METHYLPHENIDATE HCL ER (OSM) 36 MG PO TBCR: 36.0000 mg | EXTENDED_RELEASE_TABLET | Freq: Every day | ORAL | 0 refills | Status: DC

## 2023-02-20 NOTE — Patient Instructions (Signed)

## 2023-02-20 NOTE — Progress Notes (Signed)
Complete physical exam  Patient: Susan Perez   DOB: 19-Oct-2001   22 y.o. Female  MRN: [id number]  Subjective:    Chief Complaint  Patient presents with   Annual Exam    Lisseth Basnett is a 22 y.o. female who presents today for a complete physical exam. She reports consuming a  low calorie  diet. Home exercise routine includes calisthenics. Gym/ health club routine includes cardio and low impact aerobics. She generally feels well. She reports sleeping well. She does not have additional problems to discuss today.   She reports that ADHD is well controlled with concerta 36 mg. She really just takes this every few days. Denies side effects. She has another year of program.   Most recent fall risk assessment:    09/25/2022    2:12 PM  Fall Risk   Falls in the past year? 0     Most recent depression screenings:    02/20/2023   10:05 AM 11/19/2022   10:13 AM  PHQ 2/9 Scores  PHQ - 2 Score 2 1  PHQ- 9 Score 6 7    Vision:Not within last year  and Dental: No current dental problems and Receives regular dental care    Patient Care Team: Gwenlyn Perking, FNP as PCP - General (Family Medicine)   Outpatient Medications Prior to Visit  Medication Sig   desvenlafaxine (PRISTIQ) 50 MG 24 hr tablet Take 1 tablet (50 mg total) by mouth daily.   methylphenidate 36 MG PO CR tablet Take 1 tablet (36 mg total) by mouth daily.   No facility-administered medications prior to visit.    ROS Negative unless specially indicated above in HPI.     Objective:     BP 111/72   Pulse 72   Temp 98.2 F (36.8 C) (Oral)   Resp 20   Ht 5' 4.5" (1.638 m)   Wt 175 lb (79.4 kg)   SpO2 98%   BMI 29.57 kg/m    Physical Exam Vitals and nursing note reviewed.  Constitutional:      General: She is not in acute distress.    Appearance: Normal appearance. She is not ill-appearing.  HENT:     Head: Normocephalic.     Right Ear: Tympanic membrane, ear canal and external ear normal.      Left Ear: Tympanic membrane, ear canal and external ear normal.     Nose: Nose normal.     Mouth/Throat:     Mouth: Mucous membranes are moist.     Pharynx: Oropharynx is clear.  Eyes:     Extraocular Movements: Extraocular movements intact.     Conjunctiva/sclera: Conjunctivae normal.     Pupils: Pupils are equal, round, and reactive to light.  Cardiovascular:     Rate and Rhythm: Normal rate and regular rhythm.     Pulses: Normal pulses.     Heart sounds: Normal heart sounds. No murmur heard.    No friction rub. No gallop.  Pulmonary:     Effort: Pulmonary effort is normal.     Breath sounds: Normal breath sounds.  Abdominal:     General: Bowel sounds are normal. There is no distension.     Palpations: Abdomen is soft. There is no mass.     Tenderness: There is no abdominal tenderness. There is no guarding.  Musculoskeletal:     Cervical back: Normal range of motion and neck supple. No tenderness.     Right lower leg: No edema.  Left lower leg: No edema.  Skin:    General: Skin is warm and dry.     Capillary Refill: Capillary refill takes less than 2 seconds.     Findings: No lesion or rash.  Neurological:     General: No focal deficit present.     Mental Status: She is alert and oriented to person, place, and time.     Cranial Nerves: No cranial nerve deficit.     Motor: No weakness.     Gait: Gait normal.  Psychiatric:        Mood and Affect: Mood normal.        Behavior: Behavior normal.        Thought Content: Thought content normal.        Judgment: Judgment normal.      No results found for any visits on 02/20/23.     Assessment & Plan:    Routine Health Maintenance and Physical Exam Suha was seen today for annual exam.  Diagnoses and all orders for this visit:  Routine general medical examination at a health care facility  Encounter for screening for lipid disorder -     Lipid panel  Screening for endocrine, metabolic and immunity disorder -      CBC with Differential/Platelet -     CMP14+EGFR -     TSH  Attention deficit hyperactivity disorder (ADHD), combined type Controlled substance agreement signed Well controlled on current regimen. PDMP reviewed, no red flags. CSA is UTD. UDS today.  -     methylphenidate 36 MG PO CR tablet; Take 1 tablet (36 mg total) by mouth daily. -     methylphenidate 36 MG PO CR tablet; Take 1 tablet (36 mg total) by mouth daily. -     ToxASSURE Select 13 (MW), Urine     Immunization History  Administered Date(s) Administered   DTaP 08/12/2002, 10/10/2002, 03/03/2003, 12/18/2003, 08/06/2006   HIB (PRP-OMP) 08/12/2002, 03/03/2003, 06/29/2003, 12/18/2003   HPV 9-valent 10/17/2015   HPV Quadrivalent 10/17/2015, 12/12/2015, 08/15/2016   Hepatitis A 08/25/2007   Hepatitis B 11/25/02, 07/13/2002, 03/03/2003   IPV 08/12/2002, 10/10/2002, 03/03/2003, 08/06/2006   Influenza,inj,Quad PF,6+ Mos 10/23/2014, 09/28/2015, 10/21/2016, 11/20/2021, 09/25/2022   Influenza-Unspecified 10/23/2014, 09/28/2015, 10/21/2016   MMR 06/29/2003, 08/06/2006   Meningococcal B, OMV 10/18/2019   Meningococcal Conjugate 10/04/2014   Meningococcal Mcv4o 10/18/2019   PFIZER(Purple Top)SARS-COV-2 Vaccination 03/22/2020, 04/14/2020   Pfizer Covid-19 Vaccine Bivalent Booster 32yr & up 01/02/2021   Pneumococcal Conjugate-13 08/12/2002, 10/10/2002, 03/03/2003   Tdap 08/23/2013   Varicella 06/29/2003, 06/19/2016    Health Maintenance  Topic Date Due   HIV Screening  Never done   Hepatitis C Screening  Never done   COVID-19 Vaccine (4 - 2023-24 season) 08/29/2022   DTaP/Tdap/Td (7 - Td or Tdap) 08/24/2023   INFLUENZA VACCINE  Completed   HPV VACCINES  Completed    Discussed health benefits of physical activity, and encouraged her to engage in regular exercise appropriate for her age and condition.  Problem List Items Addressed This Visit       Other   Attention deficit hyperactivity disorder (ADHD)   Relevant  Medications   methylphenidate 36 MG PO CR tablet   methylphenidate 36 MG PO CR tablet (Start on 03/23/2023)   Other Relevant Orders   ToxASSURE Select 13 (MW), Urine   Controlled substance agreement signed   Relevant Medications   methylphenidate 36 MG PO CR tablet   methylphenidate 36 MG PO CR tablet (Start on  03/23/2023)   Other Relevant Orders   ToxASSURE Select 13 (MW), Urine   Other Visit Diagnoses     Routine general medical examination at a health care facility    -  Primary   Encounter for screening for lipid disorder       Relevant Orders   Lipid panel   Screening for endocrine, metabolic and immunity disorder       Relevant Orders   CBC with Differential/Platelet   CMP14+EGFR   TSH      Return in about 3 months (around 05/21/2023) for chronic follow up.  The patient indicates understanding of these issues and agrees with the plan.    Gwenlyn Perking, FNP

## 2023-02-24 LAB — TOXASSURE SELECT 13 (MW), URINE

## 2023-06-01 ENCOUNTER — Encounter: Payer: Self-pay | Admitting: Family Medicine

## 2023-06-01 ENCOUNTER — Ambulatory Visit (INDEPENDENT_AMBULATORY_CARE_PROVIDER_SITE_OTHER): Payer: BC Managed Care – PPO | Admitting: Family Medicine

## 2023-06-01 VITALS — BP 95/60 | HR 67 | Temp 98.3°F | Ht 64.5 in | Wt 171.4 lb

## 2023-06-01 DIAGNOSIS — F320 Major depressive disorder, single episode, mild: Secondary | ICD-10-CM

## 2023-06-01 DIAGNOSIS — F902 Attention-deficit hyperactivity disorder, combined type: Secondary | ICD-10-CM | POA: Diagnosis not present

## 2023-06-01 DIAGNOSIS — F411 Generalized anxiety disorder: Secondary | ICD-10-CM | POA: Diagnosis not present

## 2023-06-01 DIAGNOSIS — Z79899 Other long term (current) drug therapy: Secondary | ICD-10-CM

## 2023-06-01 MED ORDER — METHYLPHENIDATE HCL ER (OSM) 36 MG PO TBCR: 36.0000 mg | EXTENDED_RELEASE_TABLET | Freq: Every day | ORAL | 0 refills | Status: DC

## 2023-06-01 NOTE — Progress Notes (Signed)
Established Patient Office Visit  Subjective   Patient ID: Susan Perez, female    DOB: June 29, 2001  Age: 22 y.o. MRN: [id number]  Chief Complaint  Patient presents with   ADHD    HPI Susan Perez is here for an ADHD follow up. She reports doing well since her last visit. She continues to take concerta prn. She denies side effects. She has doing well with school and has graduated the first portion of her program. She will finished the second part in 1 years. She will be doing courses over the summer.   She reports doing well with pristiq as well. Reports symptoms are manageable and does not feel like medication needs to be adjusted. Denies side effects.       06/01/2023    8:22 AM 02/20/2023   10:05 AM 11/19/2022   10:13 AM  Depression screen PHQ 2/9  Decreased Interest 2 1 1   Down, Depressed, Hopeless 1 1 0  PHQ - 2 Score 3 2 1   Altered sleeping 1 1 1   Tired, decreased energy 1 1 2   Change in appetite 0 0 1  Feeling bad or failure about yourself  0 0 0  Trouble concentrating 1 2 2   Moving slowly or fidgety/restless 0 0 0  Suicidal thoughts 0 0 0  PHQ-9 Score 6 6 7   Difficult doing work/chores Somewhat difficult Somewhat difficult Somewhat difficult      06/01/2023    8:23 AM 02/20/2023   10:05 AM 11/19/2022   10:14 AM 09/25/2022    2:17 PM  GAD 7 : Generalized Anxiety Score  Nervous, Anxious, on Edge 1 2 1 2   Control/stop worrying 0 1 0 2  Worry too much - different things 1 1 0 1  Trouble relaxing 0 0 1 1  Restless 0 1 1 1   Easily annoyed or irritable 1 1 1 1   Afraid - awful might happen 0 0 0 0  Total GAD 7 Score 3 6 4 8   Anxiety Difficulty Somewhat difficult Somewhat difficult Somewhat difficult Somewhat difficult       ROS As per HPI.    Objective:     BP 95/60   Pulse 67   Temp 98.3 F (36.8 C) (Temporal)   Ht 5' 4.5" (1.638 m)   Wt 171 lb 6 oz (77.7 kg)   SpO2 96%   BMI 28.96 kg/m  Wt Readings from Last 3 Encounters:  06/01/23 171 lb 6 oz (77.7 kg)   02/20/23 175 lb (79.4 kg)  01/11/23 175 lb (79.4 kg)      Physical Exam Vitals and nursing note reviewed.  Constitutional:      General: She is not in acute distress.    Appearance: Normal appearance. She is not ill-appearing.  Cardiovascular:     Rate and Rhythm: Normal rate and regular rhythm.     Pulses: Normal pulses.     Heart sounds: Normal heart sounds. No murmur heard. Pulmonary:     Effort: Pulmonary effort is normal. No respiratory distress.     Breath sounds: Normal breath sounds.  Abdominal:     General: Bowel sounds are normal. There is no distension.     Palpations: Abdomen is soft. There is no mass.     Tenderness: There is no abdominal tenderness. There is no guarding or rebound.  Musculoskeletal:     Cervical back: Neck supple. No tenderness.     Right lower leg: No edema.     Left lower leg: No  edema.  Lymphadenopathy:     Cervical: No cervical adenopathy.  Skin:    General: Skin is warm and dry.  Neurological:     General: No focal deficit present.     Mental Status: She is alert and oriented to person, place, and time.  Psychiatric:        Mood and Affect: Mood normal.        Behavior: Behavior normal.        Thought Content: Thought content normal.        Judgment: Judgment normal.      No results found for any visits on 06/01/23.    The ASCVD Risk score (Arnett DK, et al., 2019) failed to calculate for the following reasons:   The 2019 ASCVD risk score is only valid for ages 63 to 15    Assessment & Plan:   Susan Perez was seen today for adhd.  Diagnoses and all orders for this visit:  Attention deficit hyperactivity disorder (ADHD), combined type Controlled substance agreement signed Well controlled on current regimen. PDMP reviewed, no red flags. CSA and UDS are UTD.  -     methylphenidate 36 MG PO CR tablet; Take 1 tablet (36 mg total) by mouth daily. -     methylphenidate 36 MG PO CR tablet; Take 1 tablet (36 mg total) by mouth  daily. -     methylphenidate 36 MG PO CR tablet; Take 1 tablet (36 mg total) by mouth daily.  Depression, major, single episode, mild ([facility]) GAD (generalized anxiety disorder) Well controlled on current regimen.    Return in about 3 months (around 09/01/2023).   The patient indicates understanding of these issues and agrees with the plan.  Gabriel Earing, FNP

## 2023-07-29 ENCOUNTER — Ambulatory Visit (INDEPENDENT_AMBULATORY_CARE_PROVIDER_SITE_OTHER): Payer: Self-pay | Admitting: Family Medicine

## 2023-07-29 ENCOUNTER — Other Ambulatory Visit: Payer: Self-pay | Admitting: Family Medicine

## 2023-07-29 ENCOUNTER — Encounter: Payer: Self-pay | Admitting: Family Medicine

## 2023-07-29 VITALS — BP 121/72 | HR 97 | Temp 98.6°F | Ht 64.5 in | Wt 174.1 lb

## 2023-07-29 DIAGNOSIS — Z113 Encounter for screening for infections with a predominantly sexual mode of transmission: Secondary | ICD-10-CM

## 2023-07-29 DIAGNOSIS — Z7251 High risk heterosexual behavior: Secondary | ICD-10-CM

## 2023-07-29 NOTE — Progress Notes (Signed)
   Established Patient Office Visit  Subjective   Patient ID: Susan Perez, female    DOB: [birth year]  Age: 22 y.o. MRN: [id number]  Chief Complaint  Patient presents with   Exposure to STD    HPI  [name] is here for STD screening. She is sexually active and does not use protection. She has had the same female partner for the last few months. She denies known exposure or symptoms and would just like to be cautious and have testing done. She is currently on her menstrual cycle.     ROS As per HPI.    Objective:     Ht 5' 4.5" (1.638 m)   Wt 174 lb 2 oz (79 kg)   BMI 29.43 kg/m    Physical Exam Vitals and nursing note reviewed.  Constitutional:      General: She is not in acute distress.    Appearance: Normal appearance. She is not ill-appearing.  Pulmonary:     Effort: Pulmonary effort is normal. No respiratory distress.  Musculoskeletal:     Cervical back: Neck supple. No tenderness.     Right lower leg: No edema.     Left lower leg: No edema.  Lymphadenopathy:     Cervical: No cervical adenopathy.  Skin:    General: Skin is warm and dry.  Neurological:     General: No focal deficit present.     Mental Status: She is alert and oriented to person, place, and time.  Psychiatric:        Mood and Affect: Mood normal.        Behavior: Behavior normal.        Thought Content: Thought content normal.        Judgment: Judgment normal.      No results found for any visits on 07/29/23.    The ASCVD Risk score (Arnett DK, et al., 2019) failed to calculate for the following reasons:   The 2019 ASCVD risk score is only valid for ages 60 to 34    Assessment & Plan:   Tiairra was seen today for exposure to std.  Diagnoses and all orders for this visit:  Screening for STD (sexually transmitted disease) High risk sexual behavior, unspecified type No known exposure. No symptoms. Labs pending. Will do pap with next CPE.  -     HepB+HepC+HIV Panel -     RPR -      Urine cytology ancillary only  Keep scheduled appt.   The patient indicates understanding of these issues and agrees with the plan.   Gabriel Earing, FNP

## 2023-07-30 LAB — HEPB+HEPC+HIV PANEL
HIV Screen 4th Generation wRfx: NONREACTIVE
Hep B C IgM: NEGATIVE
Hep B Core Total Ab: NEGATIVE
Hep B E Ab: NONREACTIVE
Hep B E Ag: NEGATIVE
Hep B Surface Ab, Qual: REACTIVE
Hep C Virus Ab: NONREACTIVE
Hepatitis B Surface Ag: NEGATIVE

## 2023-07-30 LAB — RPR: RPR Ser Ql: NONREACTIVE

## 2023-09-02 ENCOUNTER — Ambulatory Visit: Payer: BC Managed Care – PPO | Admitting: Family Medicine

## 2023-10-08 ENCOUNTER — Ambulatory Visit: Payer: BC Managed Care – PPO | Admitting: Family Medicine

## 2023-10-09 ENCOUNTER — Ambulatory Visit: Payer: BC Managed Care – PPO | Admitting: Family Medicine

## 2023-10-15 ENCOUNTER — Encounter: Payer: Self-pay | Admitting: Family Medicine

## 2024-06-08 ENCOUNTER — Encounter: Payer: Self-pay | Admitting: Family Medicine

## 2024-06-08 ENCOUNTER — Ambulatory Visit: Payer: Self-pay | Admitting: Family Medicine

## 2024-06-08 VITALS — BP 114/78 | HR 92 | Temp 98.8°F | Ht 64.5 in | Wt 181.0 lb

## 2024-06-08 DIAGNOSIS — F411 Generalized anxiety disorder: Secondary | ICD-10-CM

## 2024-06-08 DIAGNOSIS — Z0001 Encounter for general adult medical examination with abnormal findings: Secondary | ICD-10-CM

## 2024-06-08 DIAGNOSIS — Z79899 Other long term (current) drug therapy: Secondary | ICD-10-CM

## 2024-06-08 DIAGNOSIS — Z13228 Encounter for screening for other metabolic disorders: Secondary | ICD-10-CM

## 2024-06-08 DIAGNOSIS — Z130 Encounter for screening for diseases of the blood and blood-forming organs and certain disorders involving the immune mechanism: Secondary | ICD-10-CM

## 2024-06-08 DIAGNOSIS — Z23 Encounter for immunization: Secondary | ICD-10-CM

## 2024-06-08 DIAGNOSIS — Z1329 Encounter for screening for other suspected endocrine disorder: Secondary | ICD-10-CM | POA: Diagnosis not present

## 2024-06-08 DIAGNOSIS — F320 Major depressive disorder, single episode, mild: Secondary | ICD-10-CM

## 2024-06-08 DIAGNOSIS — Z0000 Encounter for general adult medical examination without abnormal findings: Secondary | ICD-10-CM

## 2024-06-08 DIAGNOSIS — F902 Attention-deficit hyperactivity disorder, combined type: Secondary | ICD-10-CM

## 2024-06-08 MED ORDER — METHYLPHENIDATE HCL ER (OSM) 18 MG PO TBCR
18.0000 mg | EXTENDED_RELEASE_TABLET | Freq: Every day | ORAL | 0 refills | Status: DC
Start: 2024-06-08 — End: 2024-09-12

## 2024-06-08 MED ORDER — DESVENLAFAXINE SUCCINATE ER 50 MG PO TB24
50.0000 mg | ORAL_TABLET | Freq: Every day | ORAL | 3 refills | Status: AC
Start: 2024-06-08 — End: ?

## 2024-06-08 MED ORDER — METHYLPHENIDATE HCL ER (OSM) 18 MG PO TBCR
18.0000 mg | EXTENDED_RELEASE_TABLET | Freq: Every day | ORAL | 0 refills | Status: DC
Start: 2024-07-08 — End: 2024-09-12

## 2024-06-08 MED ORDER — METHYLPHENIDATE HCL ER (OSM) 18 MG PO TBCR
18.0000 mg | EXTENDED_RELEASE_TABLET | Freq: Every day | ORAL | 0 refills | Status: DC
Start: 2024-08-07 — End: 2024-09-12

## 2024-06-08 NOTE — Patient Instructions (Signed)

## 2024-06-08 NOTE — Progress Notes (Signed)
 Complete physical exam  Patient: Susan Perez   DOB: 2001/02/18   23 y.o. Female  MRN: [id number]  Subjective:     Chief Complaint  Patient presents with   Annual Exam    Susan Perez is a 23 y.o. female who presents today for a complete physical exam. She reports consuming a general diet. The patient does not participate in regular exercise at present. She generally feels fairly well. She reports sleeping up and down. She does have additional problems to discuss today.   Increased anxiety and depression symptoms lately. Will finish up college this months and feels a little lost, not knowing what to do next. Working with counselor on this. Hasn't been taking pristiq  consistently. Hasn't had concerta  in awhile. She would like to restart this, but at a lower dose. She felt too energized and had abdominal cramping with the 36 mg dosage.   Most recent fall risk assessment:    06/08/2024    3:12 PM  Fall Risk   Falls in the past year? 0     Most recent depression screenings:    06/08/2024    3:12 PM 06/01/2023    8:22 AM 02/20/2023   10:05 AM  Depression screen PHQ 2/9  Decreased Interest 1 2 1   Down, Depressed, Hopeless 1 1 1   PHQ - 2 Score 2 3 2   Altered sleeping 2 1 1   Tired, decreased energy 2 1 1   Change in appetite 0 0 0  Feeling bad or failure about yourself  1 0 0  Trouble concentrating 3 1 2   Moving slowly or fidgety/restless 0 0 0  Suicidal thoughts 0 0 0  PHQ-9 Score 10 6 6   Difficult doing work/chores Very difficult Somewhat difficult Somewhat difficult      06/08/2024    3:13 PM 06/01/2023    8:23 AM 02/20/2023   10:05 AM 11/19/2022   10:14 AM  GAD 7 : Generalized Anxiety Score  Nervous, Anxious, on Edge 3 1 2 1   Control/stop worrying 1 0 1 0  Worry too much - different things 2 1 1  0  Trouble relaxing 2 0 0 1  Restless 2 0 1 1  Easily annoyed or irritable 1 1 1 1   Afraid - awful might happen 2 0 0 0  Total GAD 7 Score 13 3 6 4   Anxiety Difficulty Very  difficult Somewhat difficult Somewhat difficult Somewhat difficult          Patient Care Team: Albertha Huger, FNP as PCP - General (Family Medicine)   Outpatient Medications Prior to Visit  Medication Sig   desvenlafaxine  (PRISTIQ ) 50 MG 24 hr tablet Take 1 tablet (50 mg total) by mouth daily.   methylphenidate  36 MG PO CR tablet Take 1 tablet (36 mg total) by mouth daily.   methylphenidate  36 MG PO CR tablet Take 1 tablet (36 mg total) by mouth daily.   methylphenidate  36 MG PO CR tablet Take 1 tablet (36 mg total) by mouth daily.   No facility-administered medications prior to visit.    ROS Negative unless specially indicated above in HPI.     Objective:     BP 114/78   Pulse 92   Temp 98.8 F (37.1 C) (Temporal)   Ht 5' 4.5 (1.638 m)   Wt 181 lb (82.1 kg)   SpO2 100%   BMI 30.59 kg/m    Physical Exam Vitals and nursing note reviewed.  Constitutional:      General: She  is not in acute distress.    Appearance: Normal appearance. She is not ill-appearing.  HENT:     Head: Normocephalic.     Right Ear: Tympanic membrane, ear canal and external ear normal.     Left Ear: Tympanic membrane, ear canal and external ear normal.     Nose: Nose normal.     Mouth/Throat:     Mouth: Mucous membranes are moist.     Pharynx: Oropharynx is clear.  Eyes:     Extraocular Movements: Extraocular movements intact.     Conjunctiva/sclera: Conjunctivae normal.     Pupils: Pupils are equal, round, and reactive to light.  Neck:     Thyroid : No thyroid  mass, thyromegaly or thyroid  tenderness.  Cardiovascular:     Rate and Rhythm: Normal rate and regular rhythm.     Pulses: Normal pulses.     Heart sounds: Normal heart sounds. No murmur heard.    No friction rub. No gallop.  Pulmonary:     Effort: Pulmonary effort is normal.     Breath sounds: Normal breath sounds.  Abdominal:     General: Bowel sounds are normal. There is no distension.     Palpations: Abdomen is  soft. There is no mass.     Tenderness: There is no abdominal tenderness. There is no guarding.  Musculoskeletal:     Cervical back: Normal range of motion and neck supple. No tenderness.     Right lower leg: No edema.     Left lower leg: No edema.  Skin:    General: Skin is warm and dry.     Capillary Refill: Capillary refill takes less than 2 seconds.     Findings: No lesion or rash.  Neurological:     General: No focal deficit present.     Mental Status: She is alert and oriented to person, place, and time.     Cranial Nerves: No cranial nerve deficit.     Motor: No weakness.     Gait: Gait normal.  Psychiatric:        Mood and Affect: Mood normal.        Behavior: Behavior normal.        Thought Content: Thought content normal.        Judgment: Judgment normal.      No results found for any visits on 06/08/24.     Assessment & Plan:    Routine Health Maintenance and Physical Exam  Shantale was seen today for annual exam.  Diagnoses and all orders for this visit:  Routine general medical examination at a health care facility  GAD (generalized anxiety disorder) Depression, major, single episode, mild (HCC) Uncontrolled. Denies SI. Denies compliance with pristiq . Currently in counseling at college but will no longer be able to see this counseling when she graduates at the end of the month. Referral for new counselor placed.  -     desvenlafaxine  (PRISTIQ ) 50 MG 24 hr tablet; Take 1 tablet (50 mg total) by mouth daily. -     Cancel: Ambulatory referral to Psychology -     Ambulatory referral to Psychology  Attention deficit hyperactivity disorder (ADHD), combined type Updated CSA today. UDS pending- has been out of medication, so should be negative. Will try lower dose as she was had abdominal cramping with 36 mg dosage.  -     ToxASSURE Select 13 (MW), Urine -     methylphenidate  (CONCERTA ) 18 MG PO CR tablet; Take 1 tablet (18 mg  total) by mouth daily. -      methylphenidate  (CONCERTA ) 18 MG PO CR tablet; Take 1 tablet (18 mg total) by mouth daily. -     methylphenidate  (CONCERTA ) 18 MG PO CR tablet; Take 1 tablet (18 mg total) by mouth daily. -     Ambulatory referral to Psychology  Controlled substance agreement signed -     ToxASSURE Select 13 (MW), Urine  Screening for endocrine, metabolic and immunity disorder Nonfasting labs pending.  -     CBC with Differential/Platelet -     CMP14+EGFR -     TSH  Need for vaccination -     Tdap vaccine greater than or equal to 7yo IM    Immunization History  Administered Date(s) Administered   DTaP 08/12/2002, 10/10/2002, 03/03/2003, 12/18/2003, 08/06/2006   HIB (PRP-OMP) 08/12/2002, 03/03/2003, 06/29/2003, 12/18/2003   HPV 9-valent 10/17/2015   HPV Quadrivalent 10/17/2015, 12/12/2015, 08/15/2016   Hepatitis A 08/25/2007   Hepatitis B June 10, 2002, 07/13/2002, 03/03/2003   IPV 08/12/2002, 10/10/2002, 03/03/2003, 08/06/2006   Influenza,inj,Quad PF,6+ Mos 10/23/2014, 09/28/2015, 10/21/2016, 11/20/2021, 09/25/2022   Influenza-Unspecified 10/23/2014, 09/28/2015, 10/21/2016   MMR 06/29/2003, 08/06/2006   Meningococcal B, OMV 10/18/2019   Meningococcal Conjugate 10/04/2014   Meningococcal Mcv4o 10/18/2019   PFIZER(Purple Top)SARS-COV-2 Vaccination 03/22/2020, 04/14/2020   Pfizer Covid-19 Vaccine Bivalent Booster 45yrs & up 01/02/2021   Pneumococcal Conjugate-13 08/12/2002, 10/10/2002, 03/03/2003   Tdap 08/23/2013   Varicella 06/29/2003, 06/19/2016    Health Maintenance  Topic Date Due   Meningococcal B Vaccine (2 of 2 - Bexsero SCDM 2-dose series) 04/17/2020   Cervical Cancer Screening (Pap smear)  Never done   COVID-19 Vaccine (4 - 2024-25 season) 06/24/2025 (Originally 08/30/2023)   INFLUENZA VACCINE  07/29/2024   DTaP/Tdap/Td (8 - Td or Tdap) 06/08/2034   HPV VACCINES  Completed   Hepatitis C Screening  Completed   HIV Screening  Completed   Pneumococcal Vaccine 43-91 Years old  Aged Out     Discussed health benefits of physical activity, and encouraged her to engage in regular exercise appropriate for her age and condition.  Problem List Items Addressed This Visit       Other   Depression, major, single episode, mild (HCC)   Relevant Medications   desvenlafaxine  (PRISTIQ ) 50 MG 24 hr tablet   Other Relevant Orders   Ambulatory referral to Psychology   GAD (generalized anxiety disorder)   Relevant Medications   desvenlafaxine  (PRISTIQ ) 50 MG 24 hr tablet   Other Relevant Orders   Ambulatory referral to Psychology   Attention deficit hyperactivity disorder (ADHD)   Relevant Medications   methylphenidate  (CONCERTA ) 18 MG PO CR tablet   methylphenidate  (CONCERTA ) 18 MG PO CR tablet (Start on 07/08/2024)   methylphenidate  (CONCERTA ) 18 MG PO CR tablet (Start on 08/07/2024)   Other Relevant Orders   ToxASSURE Select 13 (MW), Urine   Ambulatory referral to Psychology   Controlled substance agreement signed   Relevant Orders   ToxASSURE Select 13 (MW), Urine   Other Visit Diagnoses       Routine general medical examination at a health care facility    -  Primary     Screening for endocrine, metabolic and immunity disorder       Relevant Orders   CBC with Differential/Platelet   CMP14+EGFR   TSH     Need for vaccination       Relevant Orders   Tdap vaccine greater than or equal to 7yo IM (Completed)  Return in about 3 months (around 09/08/2024) for medication follow up.   The patient indicates understanding of these issues and agrees with the plan.  Albertha Huger, FNP

## 2024-06-09 LAB — CBC WITH DIFFERENTIAL/PLATELET
Basophils Absolute: 0 10*3/uL (ref 0.0–0.2)
Basos: 0 %
EOS (ABSOLUTE): 0.1 10*3/uL (ref 0.0–0.4)
Eos: 1 %
Hematocrit: 42.2 % (ref 34.0–46.6)
Hemoglobin: 13.8 g/dL (ref 11.1–15.9)
Immature Grans (Abs): 0 10*3/uL (ref 0.0–0.1)
Immature Granulocytes: 0 %
Lymphocytes Absolute: 2.2 10*3/uL (ref 0.7–3.1)
Lymphs: 24 %
MCH: 29.9 pg (ref 26.6–33.0)
MCHC: 32.7 g/dL (ref 31.5–35.7)
MCV: 91 fL (ref 79–97)
Monocytes Absolute: 0.8 10*3/uL (ref 0.1–0.9)
Monocytes: 8 %
Neutrophils Absolute: 6 10*3/uL (ref 1.4–7.0)
Neutrophils: 67 %
Platelets: 326 10*3/uL (ref 150–450)
RBC: 4.62 x10E6/uL (ref 3.77–5.28)
RDW: 12.3 % (ref 11.7–15.4)
WBC: 9 10*3/uL (ref 3.4–10.8)

## 2024-06-09 LAB — CMP14+EGFR
ALT: 7 IU/L (ref 0–32)
AST: 15 IU/L (ref 0–40)
Albumin: 4.4 g/dL (ref 4.0–5.0)
Alkaline Phosphatase: 85 IU/L (ref 44–121)
BUN/Creatinine Ratio: 21 (ref 9–23)
BUN: 13 mg/dL (ref 6–20)
Bilirubin Total: <0.2 mg/dL (ref 0.0–1.2)
CO2: 22 mmol/L (ref 20–29)
Calcium: 9.6 mg/dL (ref 8.7–10.2)
Chloride: 101 mmol/L (ref 96–106)
Creatinine, Ser: 0.62 mg/dL (ref 0.57–1.00)
Globulin, Total: 2.8 g/dL (ref 1.5–4.5)
Glucose: 82 mg/dL (ref 70–99)
Potassium: 4.2 mmol/L (ref 3.5–5.2)
Sodium: 137 mmol/L (ref 134–144)
Total Protein: 7.2 g/dL (ref 6.0–8.5)
eGFR: 129 mL/min/{1.73_m2} (ref >59)

## 2024-06-09 LAB — TSH: TSH: 1.71 u[IU]/mL (ref 0.450–4.500)

## 2024-06-10 ENCOUNTER — Ambulatory Visit: Payer: Self-pay | Admitting: Family Medicine

## 2024-06-13 LAB — TOXASSURE SELECT 13 (MW), URINE

## 2024-07-15 ENCOUNTER — Encounter: Payer: Self-pay | Admitting: Advanced Practice Midwife

## 2024-08-12 ENCOUNTER — Encounter: Payer: Self-pay | Admitting: Family Medicine

## 2024-09-12 ENCOUNTER — Encounter: Payer: Self-pay | Admitting: Family Medicine

## 2024-09-12 ENCOUNTER — Ambulatory Visit (INDEPENDENT_AMBULATORY_CARE_PROVIDER_SITE_OTHER): Admitting: Family Medicine

## 2024-09-12 VITALS — BP 107/63 | HR 72 | Temp 98.6°F | Ht 64.5 in | Wt 176.4 lb

## 2024-09-12 DIAGNOSIS — F902 Attention-deficit hyperactivity disorder, combined type: Secondary | ICD-10-CM

## 2024-09-12 DIAGNOSIS — F411 Generalized anxiety disorder: Secondary | ICD-10-CM | POA: Diagnosis not present

## 2024-09-12 DIAGNOSIS — F339 Major depressive disorder, recurrent, unspecified: Secondary | ICD-10-CM

## 2024-09-12 DIAGNOSIS — Z23 Encounter for immunization: Secondary | ICD-10-CM

## 2024-09-12 MED ORDER — METHYLPHENIDATE HCL ER (OSM) 18 MG PO TBCR: 18.0000 mg | EXTENDED_RELEASE_TABLET | Freq: Every day | ORAL | 0 refills | Status: DC

## 2024-09-12 NOTE — Progress Notes (Signed)
 Established Patient Office Visit  Subjective   Patient ID: Susan Perez, female    DOB: August 12, 2001  Age: 23 y.o. MRN: [id number]  Chief Complaint  Patient presents with   ADHD    HPI  History of Present Illness   Susan Perez is a 23 year old female who presents for refills on Concerta .  Attention deficit symptoms and stimulant use - Takes Concerta  once or twice per week - Experiences prolonged therapeutic effects from Concerta  - No side effects from Concerta  use - She has completed her Master's program and is currently applying for jobs  Depressive symptoms and antidepressant use - Takes Pristiq  - Significant reduction in depressive symptoms since starting Pristiq   Anxiety symptoms - Anxiety remains variable - Job searching is a significant source of stress and contributes to anxiety - Feels like anxiety is manageable  Weight changes - Has lost a few pounds - Weight loss attributed to changes in diet and exercise          09/12/2024    9:33 AM 06/08/2024    3:12 PM 06/01/2023    8:22 AM  Depression screen PHQ 2/9  Decreased Interest 1 1 2   Down, Depressed, Hopeless 1 1 1   PHQ - 2 Score 2 2 3   Altered sleeping 2 2 1   Tired, decreased energy 1 2 1   Change in appetite 0 0 0  Feeling bad or failure about yourself  1 1 0  Trouble concentrating 1 3 1   Moving slowly or fidgety/restless 0 0 0  Suicidal thoughts 0 0 0  PHQ-9 Score 7 10 6   Difficult doing work/chores Somewhat difficult Very difficult Somewhat difficult      09/12/2024    9:34 AM 06/08/2024    3:13 PM 06/01/2023    8:23 AM 02/20/2023   10:05 AM  GAD 7 : Generalized Anxiety Score  Nervous, Anxious, on Edge 3 3 1 2   Control/stop worrying 1 1 0 1  Worry too much - different things 1 2 1 1   Trouble relaxing 0 2 0 0  Restless 0 2 0 1  Easily annoyed or irritable 2 1 1 1   Afraid - awful might happen 2 2 0 0  Total GAD 7 Score 9 13 3 6   Anxiety Difficulty Very difficult Very difficult Somewhat  difficult Somewhat difficult       ROS As per HPI.    Objective:     BP 107/63   Pulse 72   Temp 98.6 F (37 C) (Temporal)   Ht 5' 4.5 (1.638 m)   Wt 176 lb 6.4 oz (80 kg)   SpO2 98%   BMI 29.81 kg/m  Wt Readings from Last 3 Encounters:  09/12/24 176 lb 6.4 oz (80 kg)  06/08/24 181 lb (82.1 kg)  07/29/23 174 lb 2 oz (79 kg)      Physical Exam Vitals and nursing note reviewed.  Constitutional:      General: She is not in acute distress.    Appearance: Normal appearance. She is not ill-appearing, toxic-appearing or diaphoretic.  Cardiovascular:     Rate and Rhythm: Normal rate and regular rhythm.     Pulses: Normal pulses.     Heart sounds: Normal heart sounds. No murmur heard. Pulmonary:     Effort: Pulmonary effort is normal. No respiratory distress.     Breath sounds: Normal breath sounds.  Musculoskeletal:     Cervical back: Neck supple. No tenderness.     Right lower leg: No  edema.     Left lower leg: No edema.  Lymphadenopathy:     Cervical: No cervical adenopathy.  Skin:    General: Skin is warm and dry.  Neurological:     General: No focal deficit present.     Mental Status: She is alert and oriented to person, place, and time.  Psychiatric:        Mood and Affect: Mood normal.        Behavior: Behavior normal.      No results found for any visits on 09/12/24.    The ASCVD Risk score (Arnett DK, et al., 2019) failed to calculate for the following reasons:   The 2019 ASCVD risk score is only valid for ages 27 to 75    Assessment & Plan:   Susan Perez was seen today for adhd.  Diagnoses and all orders for this visit:  Attention deficit hyperactivity disorder (ADHD), combined type -     methylphenidate  (CONCERTA ) 18 MG PO CR tablet; Take 1 tablet (18 mg total) by mouth daily. -     methylphenidate  (CONCERTA ) 18 MG PO CR tablet; Take 1 tablet (18 mg total) by mouth daily. -     methylphenidate  (CONCERTA ) 18 MG PO CR tablet; Take 1 tablet (18 mg  total) by mouth daily.  GAD (generalized anxiety disorder)  Depression, recurrent (HCC)  Encounter for immunization -     Flu vaccine trivalent PF, 6mos and older(Flulaval,Afluria,Fluarix,Fluzone)      Attention-deficit hyperactivity disorder, combined type ADHD managed with Concerta , effective without side effects. PDMP reviewed, no red flags. CSA is UTD. - Refill Concerta  prescription. - Follow up in three months or as needed for refills.  Major depressive disorder Depression well-managed with Pristiq .  Anxiety disorder Anxiety fluctuates with stressors but is manageable.        Return in about 3 months (around 12/12/2024) for chronic follow up.   The patient indicates understanding of these issues and agrees with the plan.  Susan CHRISTELLA Search, FNP

## 2024-12-12 ENCOUNTER — Ambulatory Visit: Payer: Self-pay | Admitting: Family Medicine

## 2024-12-12 ENCOUNTER — Encounter: Payer: Self-pay | Admitting: Family Medicine

## 2024-12-12 VITALS — BP 116/72 | HR 68 | Temp 98.3°F | Ht 64.5 in | Wt 175.4 lb

## 2024-12-12 DIAGNOSIS — F411 Generalized anxiety disorder: Secondary | ICD-10-CM

## 2024-12-12 DIAGNOSIS — F902 Attention-deficit hyperactivity disorder, combined type: Secondary | ICD-10-CM

## 2024-12-12 DIAGNOSIS — F321 Major depressive disorder, single episode, moderate: Secondary | ICD-10-CM | POA: Insufficient documentation

## 2024-12-12 MED ORDER — METHYLPHENIDATE HCL ER (OSM) 18 MG PO TBCR: 18.0000 mg | EXTENDED_RELEASE_TABLET | Freq: Every day | ORAL | 0 refills | Status: AC

## 2024-12-12 NOTE — Progress Notes (Signed)
 Acute Office Visit  Subjective:     Patient ID: Susan Perez, female    DOB: [birth year], 23 y.o.   MRN: [id number]  Chief Complaint  Patient presents with   ADHD    HPI  History of Present Illness   Susan Perez is a 23 year old female with ADHD who presents for follow-up.  Attention deficit hyperactivity disorder (adhd) symptoms - Currently taking Concerta  inconsistently - Finds Concerta  beneficial, with positive effects lasting for several days even after a single dose - No adverse effects from Concerta  - Started a part time job - Will be starting some prerequisites for the [employer]  Mood and anxiety symptoms - Taking Pristiq  for depression and anxiety - Mood remains stable - Recently started a part-time job, leading to increased engagement and sense of purpose - Symptoms feel manageable  Sleep patterns - Sleeping well without reported disturbances          12/12/2024   10:22 AM 09/12/2024    9:33 AM 06/08/2024    3:12 PM  Depression screen PHQ 2/9  Decreased Interest 2 1 1   Down, Depressed, Hopeless 1 1 1   PHQ - 2 Score 3 2 2   Altered sleeping 3 2 2   Tired, decreased energy 1 1 2   Change in appetite 0 0 0  Feeling bad or failure about yourself  1 1 1   Trouble concentrating 2 1 3   Moving slowly or fidgety/restless 2 0 0  Suicidal thoughts 0 0 0  PHQ-9 Score 12 7  10    Difficult doing work/chores Somewhat difficult Somewhat difficult Very difficult     Data saved with a previous flowsheet row definition      12/12/2024   10:23 AM 09/12/2024    9:34 AM 06/08/2024    3:13 PM 06/01/2023    8:23 AM  GAD 7 : Generalized Anxiety Score  Nervous, Anxious, on Edge 2 3 3 1   Control/stop worrying 2 1 1  0  Worry too much - different things 2 1 2 1   Trouble relaxing 1 0 2 0  Restless 1 0 2 0  Easily annoyed or irritable 1 2 1 1   Afraid - awful might happen 2 2 2  0  Total GAD 7 Score 11 9 13 3   Anxiety Difficulty Somewhat difficult Very difficult Very  difficult Somewhat difficult     ROS As per HPI.      Objective:    BP 116/72   Pulse 68   Temp 98.3 F (36.8 C) (Temporal)   Ht 5' 4.5 (1.638 m)   Wt 175 lb 6.4 oz (79.6 kg)   SpO2 99%   BMI 29.64 kg/m    Physical Exam Vitals and nursing note reviewed.  Constitutional:      General: She is not in acute distress.    Appearance: Normal appearance. She is not ill-appearing, toxic-appearing or diaphoretic.  Cardiovascular:     Rate and Rhythm: Normal rate and regular rhythm.     Pulses: Normal pulses.     Heart sounds: Normal heart sounds. No murmur heard. Pulmonary:     Effort: Pulmonary effort is normal. No respiratory distress.     Breath sounds: Normal breath sounds.  Musculoskeletal:     Cervical back: Neck supple. No tenderness.     Right lower leg: No edema.     Left lower leg: No edema.  Lymphadenopathy:     Cervical: No cervical adenopathy.  Skin:    General: Skin is warm and  dry.  Neurological:     General: No focal deficit present.     Mental Status: She is alert and oriented to person, place, and time.  Psychiatric:        Mood and Affect: Mood normal.        Behavior: Behavior normal.     No results found for any visits on 12/12/24.      Assessment & Plan:   Emoni was seen today for adhd.  Diagnoses and all orders for this visit:  Attention deficit hyperactivity disorder (ADHD), combined type -     methylphenidate  (CONCERTA ) 18 MG PO CR tablet; Take 1 tablet (18 mg total) by mouth daily. -     methylphenidate  (CONCERTA ) 18 MG PO CR tablet; Take 1 tablet (18 mg total) by mouth daily. -     methylphenidate  (CONCERTA ) 18 MG PO CR tablet; Take 1 tablet (18 mg total) by mouth daily.  GAD (generalized anxiety disorder)  Depression, major, single episode, moderate (HCC)   Assessment and Plan    Attention-deficit hyperactivity disorder, combined type ADHD managed with intermittent Concerta . Effective with no side effects reported. CSA and  UDS are UTD.  - Sent refills for Concerta  for three months.  Depression and anxiety Well-managed with Pristiq . No dosage adjustment needed. - Ensured adequate supply of Pristiq  on file.      Return in about 3 months (around 03/12/2025) for chronic follow up.  The patient indicates understanding of these issues and agrees with the plan.  Susan Perez Search, FNP

## 2025-03-16 ENCOUNTER — Ambulatory Visit: Admitting: Family Medicine

## 2025-03-17 ENCOUNTER — Ambulatory Visit: Admitting: Family Medicine
